# Patient Record
Sex: Female | Born: 1945 | Race: Black or African American | Hispanic: No | Smoking: Former smoker
Health system: Southern US, Community
[De-identification: ages and names within clinical notes are randomized; demographics above are authoritative.]

## PROBLEM LIST (undated history)

## (undated) DIAGNOSIS — K219 Gastro-esophageal reflux disease without esophagitis: Secondary | ICD-10-CM

## (undated) DIAGNOSIS — G47419 Narcolepsy without cataplexy: Secondary | ICD-10-CM

## (undated) DIAGNOSIS — I219 Acute myocardial infarction, unspecified: Secondary | ICD-10-CM

## (undated) DIAGNOSIS — E039 Hypothyroidism, unspecified: Secondary | ICD-10-CM

## (undated) DIAGNOSIS — I639 Cerebral infarction, unspecified: Secondary | ICD-10-CM

## (undated) DIAGNOSIS — IMO0001 Reserved for inherently not codable concepts without codable children: Secondary | ICD-10-CM

## (undated) DIAGNOSIS — M1990 Unspecified osteoarthritis, unspecified site: Secondary | ICD-10-CM

## (undated) DIAGNOSIS — J449 Chronic obstructive pulmonary disease, unspecified: Secondary | ICD-10-CM

## (undated) DIAGNOSIS — I10 Essential (primary) hypertension: Secondary | ICD-10-CM

## (undated) DIAGNOSIS — G4730 Sleep apnea, unspecified: Secondary | ICD-10-CM

## (undated) DIAGNOSIS — E119 Type 2 diabetes mellitus without complications: Secondary | ICD-10-CM

## (undated) DIAGNOSIS — I2510 Atherosclerotic heart disease of native coronary artery without angina pectoris: Secondary | ICD-10-CM

## (undated) HISTORY — PX: CORONARY ANGIOPLASTY: SHX604

## (undated) HISTORY — PX: COLONOSCOPY: SHX174

## (undated) HISTORY — PX: OTHER SURGICAL HISTORY: SHX169

## (undated) HISTORY — PX: EYE SURGERY: SHX253

## (undated) HISTORY — PX: BREAST LUMPECTOMY: SHX2

## (undated) HISTORY — PX: ABDOMINAL HYSTERECTOMY: SHX81

---

## 2006-01-29 HISTORY — PX: CORONARY ANGIOPLASTY WITH STENT PLACEMENT: SHX49

## 2015-02-28 ENCOUNTER — Other Ambulatory Visit: Payer: Self-pay | Admitting: Neurosurgery

## 2015-03-04 NOTE — H&P (Signed)
Patient ID:   [id number] Patient: Shelley Butler  Date of Birth: [birth year] Visit Type: Office Visit   Date: 02/23/2015 02:30 PM Provider: Danae Orleans. Venetia Maxon MD   This 70 year old female presents for back pain and Leg pain.  History of Present Illness: 1.  back pain  2.  Leg pain  Shelley Butler, 70 year old retired female visits for evaluation of bilateral lower extremity pain.  Patient reports onset of pain greater than one year ago and does not recall an injury.  She states legs are painful only with standing or walking.  No pain with sitting.  Does not currently take pain medication.  Physical therapy offered no relief.  History: Narcolepsy, CAD, MI (eight years ago), HTN, IDDM, GERD, thyroid Surgical history: Vaginal repair four years ago, hysterectomy 17 years ago Followed by Dr. Beckie Salts in [facility] cardiology  Imaging on [place]  The patient complains of bilateral lower extremity pain.  She describes that she has pain with walking and that she cannot be standing for more than 15 minutes at a time.  She says it leaning forward helps but not a great deal.  She complains of pain radiating to the ankles and to the top of the ankles but not into her feet.  As that her lower extremity pain is worse than her low back pain.  The patient had injections but says that these did not help her at all.  We discussed the patient's weight and she is 5 feet 3 inches tall and 227 pounds.  She says she has been unable to lose weight and that this is due to thyroid problems and it has been very difficult for her to get weight off since she had radiation to her thyroid.  The patient had lumbar spine radiographs which show spondylolisthesis of L4 and L5 of 10 mm a neutral lateral radiograph and 9 mm on extension and 9.5 mm on flexion.  The spinal stenosis is quite marked at this level based on her MRI scan and she has severe facet arthropathy at the L4 L5 level.  She has multilevel degenerative  changes at other levels in the lumbar spine.        PAST MEDICAL/SURGICAL HISTORY   (Detailed)  Disease/disorder Onset Date Management Date Comments  Diabetes mellitus      High cholesterol      Hypertension      Thyroid disease         PAST MEDICAL HISTORY, SURGICAL HISTORY, FAMILY HISTORY, SOCIAL HISTORY AND REVIEW OF SYSTEMS I have reviewed the patient's past medical, surgical, family and social history as well as the comprehensive review of systems as included on the [facility] history form dated 02/23/2015, which I have signed.  Family History  (Detailed) Relationship Family Member Name Deceased Age at Death Condition Onset Age Cause of Death      Family history of Hypertension  N      Family history of Diabetes mellitus  N    SOCIAL HISTORY  (Detailed) Tobacco use reviewed. Preferred language is Unknown.   Smoking status: Never smoker.  SMOKING STATUS Use Status Type Smoking Status Usage Per Day Years Used Total Pack Years  no/never  Never smoker       HOME ENVIRONMENT/SAFETY The patient has not fallen in the last year.        MEDICATIONS(added, continued or stopped this visit): Started Medication Directions Instruction Stopped   aspirin take 1 tablet by oral route  every day  Dexedrine Spansule 15 mg capsule,extended release take 1 capsule by oral route 3 times every day     gemfibrozil 600 mg tablet take 1 tablet by oral route 2 times every day 30 minutes before morning and evening meal     Humalog inject 20 units by subcutaneous route 3 times every day     Lantus 100 unit/mL subcutaneous solution inject 60 units by subcutaneous route  every bedtime     levothyroxine 88 mcg tablet take 1 tablet by oral route  every day     lisinopril 20 mg-hydrochlorothiazide 12.5 mg tablet take 1 tablet by oral route  every day     metformin 500 mg tablet take 1 tablet by oral route 2 times every day with morning and evening meals      metoprolol tartrate 50 mg tablet take 1 tablet by oral route 2 times every day with meals     omeprazole 20 mg tablet,delayed release take 1 capsule by oral route  every day     pravastatin 40 mg tablet take 1 tablet by oral route  every day     Vitamin D3 Take once a week       ALLERGIES: Ingredient Reaction Medication Name Comment  NO KNOWN ALLERGIES     No known allergies.    Vitals Date Temp F BP Pulse Ht In Wt Lb BMI BSA Pain Score  02/23/2015  139/82 97 63 227 40.21  5/10     PHYSICAL EXAM General Level of Distress: no acute distress Overall Appearance: obese  Head and Face  Right Left  Fundoscopic Exam:  normal normal    Cardiovascular Cardiac: regular rate and rhythm without murmur  Right Left  Carotid Pulses: normal normal  Respiratory Lungs: clear to auscultation  Neurological Orientation: normal Recent and Remote Memory: normal Attention Span and Concentration:   normal Language: normal Fund of Knowledge: normal  Right Left Sensation: normal normal Upper Extremity Coordination: normal normal  Lower Extremity Coordination: normal normal  Musculoskeletal Gait and Station: normal  Right Left Upper Extremity Muscle Strength: normal normal Lower Extremity Muscle Strength: normal normal Upper Extremity Muscle Tone:  normal normal Lower Extremity Muscle Tone: normal normal  Motor Strength Upper and lower extremity motor strength was tested in the clinically pertinent muscles.     Deep Tendon Reflexes  Right Left Biceps: normal normal Triceps: normal normal Brachiloradialis: normal normal Patellar: decrease decrease Achilles: decrease decrease  Sensory Sensation was tested at L1 to S1.   Cranial Nerves II. Optic Nerve/Visual Fields: normal III. Oculomotor: normal IV. Trochlear: normal V. Trigeminal: normal VI. Abducens: normal VII. Facial: normal VIII. Acoustic/Vestibular: normal IX. Glossopharyngeal: normal X. Vagus: normal XI.  Spinal Accessory: normal XII. Hypoglossal: normal  Motor and other Tests Lhermittes: negative Rhomberg: negative Pronator drift: absent     Right Left Hoffman's: normal normal Clonus: normal normal Babinski: normal normal SLR: negative negative Patrick's Pearlean Brownie): negative negative Toe Walk: normal normal Toe Lift: normal normal Heel Walk: normal normal SI Joint: nontender nontender   Additional Findings:  Patient is able to bend forward of the floor with her upper extremities outstretched and she is able to stand on either leg independently and to stand on her heels and toes independently and also to squat on either leg.    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  She is now unable to function because of the severity of her pain and says that her legs are more  bothersome than her low back.  She is diabetic and also has a history of coronary artery disease and prior myocardial infarction.  Completed Orders (this encounter) Order Details Reason Side Interpretation Result Initial Treatment Date Region  Lifestyle education regarding diet Encouraged to eat a well balanced diet and follow up with primary care physician.        Lumbar Spine- AP/Lat/Flex/Ex      02/23/2015 All Levels to All Levels   Assessment/Plan # Detail Type Description   1. Assessment Low back pain, unspecified back pain laterality, with sciatica presence unspecified (M54.5).       2. Assessment Spinal stenosis of lumbar region (M48.06).       3. Assessment Body mass index (BMI) 40.0-44.9, adult (Z68.41).   Plan Orders Today's instructions / counseling include(s) Lifestyle education regarding diet.       4. Assessment Lumbar radiculopathy (M54.16).       5. Assessment Spondylolisthesis at L4-L5 level (M43.16).         Pain Assessment/Treatment Pain Scale: 5/10. Method: Numeric Pain Intensity Scale. Location: back/legs. Onset: 02/22/2014. Duration:  varies. Quality: discomforting. Pain Assessment/Treatment follow-up plan of care: Patient is taking medications as prescribed..  Fall Risk Plan The patient has not fallen in the last year.  I have recommended to the patient that she undergo cardiology clearance and that she undergo L4 L5 decompression and fusion.  She is fitted for LSO brace.  We went over detail patient teaching with her today and answered her questions and also reviewed her imaging studies.  The surgery is tentatively scheduled for 03/18/15.  Risks and benefits were discussed in detail and she wishes to proceed.  She is advised the importance of weight control.  I explained that she will need to aggressively lose weight after her surgery in order to prevent additional problems with her spine in the future.  Orders: Diagnostic Procedures: Assessment Procedure  M43.16 Lumbar Spine- AP/Lat  M54.16 Lumbar Spine- AP/Lat/Flex/Ex  Instruction(s)/Education: Assessment Instruction  Z68.41 Lifestyle education regarding diet             Provider:  Danae Orleans. Venetia Maxon MD  02/23/2015 04:33 PM Dictation edited by: Danae Orleans. Venetia Maxon    CC Providers: Freddi Starr [address] [place], [place] [address]              Electronically signed by Danae Orleans. Venetia Maxon MD on 02/23/2015 04:33 PM

## 2015-03-10 ENCOUNTER — Encounter (HOSPITAL_COMMUNITY)
Admission: RE | Admit: 2015-03-10 | Discharge: 2015-03-10 | Disposition: A | Discharge status: Home / Self Care | Payer: Medicare PPO | Source: Ambulatory Visit | Attending: Neurosurgery | Admitting: Neurosurgery

## 2015-03-10 ENCOUNTER — Encounter (HOSPITAL_COMMUNITY): Payer: Self-pay

## 2015-03-10 DIAGNOSIS — Z7982 Long term (current) use of aspirin: Secondary | ICD-10-CM | POA: Insufficient documentation

## 2015-03-10 DIAGNOSIS — I2510 Atherosclerotic heart disease of native coronary artery without angina pectoris: Secondary | ICD-10-CM | POA: Insufficient documentation

## 2015-03-10 DIAGNOSIS — E039 Hypothyroidism, unspecified: Secondary | ICD-10-CM | POA: Insufficient documentation

## 2015-03-10 DIAGNOSIS — I10 Essential (primary) hypertension: Secondary | ICD-10-CM | POA: Diagnosis not present

## 2015-03-10 DIAGNOSIS — K219 Gastro-esophageal reflux disease without esophagitis: Secondary | ICD-10-CM | POA: Diagnosis not present

## 2015-03-10 DIAGNOSIS — Z955 Presence of coronary angioplasty implant and graft: Secondary | ICD-10-CM | POA: Insufficient documentation

## 2015-03-10 DIAGNOSIS — Z87891 Personal history of nicotine dependence: Secondary | ICD-10-CM | POA: Diagnosis not present

## 2015-03-10 DIAGNOSIS — Z01812 Encounter for preprocedural laboratory examination: Secondary | ICD-10-CM | POA: Insufficient documentation

## 2015-03-10 DIAGNOSIS — Z794 Long term (current) use of insulin: Secondary | ICD-10-CM | POA: Diagnosis not present

## 2015-03-10 DIAGNOSIS — I252 Old myocardial infarction: Secondary | ICD-10-CM | POA: Diagnosis not present

## 2015-03-10 DIAGNOSIS — G47419 Narcolepsy without cataplexy: Secondary | ICD-10-CM | POA: Diagnosis not present

## 2015-03-10 DIAGNOSIS — Z01818 Encounter for other preprocedural examination: Secondary | ICD-10-CM | POA: Diagnosis not present

## 2015-03-10 DIAGNOSIS — E119 Type 2 diabetes mellitus without complications: Secondary | ICD-10-CM | POA: Insufficient documentation

## 2015-03-10 DIAGNOSIS — Z79899 Other long term (current) drug therapy: Secondary | ICD-10-CM | POA: Insufficient documentation

## 2015-03-10 DIAGNOSIS — Z0183 Encounter for blood typing: Secondary | ICD-10-CM | POA: Insufficient documentation

## 2015-03-10 HISTORY — DX: Unspecified osteoarthritis, unspecified site: M19.90

## 2015-03-10 HISTORY — DX: Acute myocardial infarction, unspecified: I21.9

## 2015-03-10 HISTORY — DX: Reserved for inherently not codable concepts without codable children: IMO0001

## 2015-03-10 HISTORY — DX: Type 2 diabetes mellitus without complications: E11.9

## 2015-03-10 HISTORY — DX: Essential (primary) hypertension: I10

## 2015-03-10 HISTORY — DX: Gastro-esophageal reflux disease without esophagitis: K21.9

## 2015-03-10 HISTORY — DX: Hypothyroidism, unspecified: E03.9

## 2015-03-10 HISTORY — DX: Sleep apnea, unspecified: G47.30

## 2015-03-10 HISTORY — DX: Narcolepsy without cataplexy: G47.419

## 2015-03-10 LAB — CBC
HCT: 40.7 % (ref 36.0–46.0)
Hemoglobin: 13.3 g/dL (ref 12.0–15.0)
MCH: 28.7 pg (ref 26.0–34.0)
MCHC: 32.7 g/dL (ref 30.0–36.0)
MCV: 87.9 fL (ref 78.0–100.0)
PLATELETS: 340 10*3/uL (ref 150–400)
RBC: 4.63 MIL/uL (ref 3.87–5.11)
RDW: 13.4 % (ref 11.5–15.5)
WBC: 9.7 10*3/uL (ref 4.0–10.5)

## 2015-03-10 LAB — BASIC METABOLIC PANEL
Anion gap: 12 (ref 5–15)
BUN: 21 mg/dL — AB (ref 6–20)
CALCIUM: 9.8 mg/dL (ref 8.9–10.3)
CO2: 26 mmol/L (ref 22–32)
CREATININE: 1.17 mg/dL — AB (ref 0.44–1.00)
Chloride: 103 mmol/L (ref 101–111)
GFR calc non Af Amer: 47 mL/min — ABNORMAL LOW (ref >60)
GFR, EST AFRICAN AMERICAN: 54 mL/min — AB (ref >60)
Glucose, Bld: 234 mg/dL — ABNORMAL HIGH (ref 65–99)
Potassium: 4.3 mmol/L (ref 3.5–5.1)
SODIUM: 141 mmol/L (ref 135–145)

## 2015-03-10 LAB — TYPE AND SCREEN
ABO/RH(D): O POS
Antibody Screen: NEGATIVE

## 2015-03-10 LAB — GLUCOSE, CAPILLARY: Glucose-Capillary: 219 mg/dL — ABNORMAL HIGH (ref 65–99)

## 2015-03-10 LAB — SURGICAL PCR SCREEN
MRSA, PCR: NEGATIVE
Staphylococcus aureus: POSITIVE — AB

## 2015-03-10 LAB — ABO/RH: ABO/RH(D): O POS

## 2015-03-10 NOTE — Progress Notes (Signed)
Shelley Butler reports having a MI in 2008, had a stent placed.  Patient is on Aspirin as Blood thinner.  Patient states that she was instructed to stop taking Aspirin on Monday, February, February 13, instructed by Shelley Butler office who told her they were in contact with her cardiologist. Shelley Butler is patient;s cardiologist. Shelley Butler denies having chest pain.  Patient does have shortness of breath when walking.  Patient reports having a stress test test in [address] , an EKG 09/2014. Patient had a postive sleep study, but does not use CPAP, cant afford it.  Patient states that Shelley Butler is her Pulmonologist and he says she has COPD   She doesn't think so.  Patients PCP is Shelley Butler, he also manages her DM.  Shelley Bible had a endrcrinocologist until sometime in 2016 when he left [place].  Patient reports that am CBG run in the 100, and last A1C was 9.6- drawn 03/07/15.  Shelley Butler reports that she had been taking Lantus every other day due to the cost, but has started taking it everyday. I talked with Shelley Butler about the importance of keeping CBG < 180 to prevent infection and assist with healing post- op.   Patient verbalizes understanding. Shelley Butler also reports having Narcolespy but no longer takes medications, "I took it when I worked."

## 2015-03-10 NOTE — Pre-Procedure Instructions (Signed)
    Shelley Butler  03/10/2015     Your procedure is scheduled on Friday, February 17  Report to [facility] Admitting at 9:15 A.M.                Your surgery or procedure is scheduled for 12:15 PM   Call this number if you have problems the morning of [phone]                    For any other questions, please call [phone], Monday - Friday 8 AM - 4 PM.  Remember:  Do not eat food or drink liquids after midnight.  Take these medicines the morning of surgery with A SIP OF WATER :levothyroxine (SYNTHROID, LEVOTHROID),   metoprolol (LOPRESSOR),   omeprazole (PRILOSEC), pravastatin (PRAVACHOL).              May use Albuterol if needed.  Do not wear jewelry, make-up or nail polish.  Do not wear lotions, powders, or perfumes.    Do not shave 48 hours prior to surgery.    Do not bring valuables to the hospital.  [facility] is not responsible for any belongings or valuables.  Contacts, dentures or bridgework may not be worn into surgery.  Leave your suitcase in the car.  After surgery it may be brought to your room.  For patients admitted to the hospital, discharge time will be determined by your treatment team.  Special instructions:  Review  [facility] - Preparing For Surgery.  Please read over the following fact sheets that you were given. Pain Booklet, Coughing and Deep Breathing, Blood Transfusion Information, MRSA Information and Surgical Site Infection Prevention

## 2015-03-10 NOTE — Progress Notes (Signed)
How to Manage Your Diabetes Before Surgery   Why is it important to control my blood sugar before and after surgery?   Improving blood sugar levels before and after surgery helps healing and can limit problems.  A way of improving blood sugar control is eating a healthy diet by:  - Eating less sugar and carbohydrates  - Increasing activity/exercise  - Talk with your doctor about reaching your blood sugar goals  High blood sugars (greater than 180 mg/dL) can raise your risk of infections and slow down your recovery so you will need to focus on controlling your diabetes during the weeks before surgery.  Make sure that the doctor who takes care of your diabetes knows about your planned surgery including the date and location.  How do I manage my blood sugars before surgery?   Check your blood sugar at least 4 times a day, 2 days before surgery to make sure that they are not too high or low.   Check your blood sugar the morning of your surgery when you wake up and every 2               hours until you get to the Short-Stay unit.  If your blood sugar is less than 70 mg/dL, you will need to treat for low blood sugar by:  Treat a low blood sugar (less than 70 mg/dL) with 1/2 cup of clear juice (cranberry or apple), 4 glucose tablets, OR glucose gel.  Recheck blood sugar in 15 minutes after treatment (to make sure it is greater than 70 mg/dL).  If blood sugar is not greater than 70 mg/dL on re-check, call [phone] for further instructions.   Report your blood sugar to the Short-Stay nurse when you get to Short-Stay.  References:  [facility], 2007 "How to Manage your Diabetes Before and After Surgery".  What do I do about my diabetes medications?   Do not take oral diabetes medicines (pills) the morning of surgery.    THE NIGHT BEFORE SURGERY, take 40 units of Lantus Insulin.    Do not take other diabetes injectables the day of surgery including  Byetta, Victoza, Bydureon, and Trulicity.    If your CBG is greater than 220 mg/dL, you may take 1/2 of your sliding scale (correction) dose of insulin.Marland Kitchen

## 2015-03-10 NOTE — Progress Notes (Signed)
I called a prescription for Mupirocin ointment to [facility], [address] , [place], [place]

## 2015-03-14 ENCOUNTER — Other Ambulatory Visit: Payer: Self-pay | Admitting: Neurosurgery

## 2015-03-14 NOTE — Progress Notes (Addendum)
Anesthesia Chart Review: Patient is a 70 year old female scheduled for L4-5 MAS, PLIF on 03/18/15 by Dr. Venetia Maxon.  History includes former smoker, DM2, CAD/MI s/p RCA stent '07, HTN, hypothyroidism, GERD, narcolepsy, arthritis, exertional dyspnea, hysterectomy. She reports a positive sleep study but does not use CPAP ("can't afford"). Cardiology note indicate a lone episode of afib 10/29/13 in the setting of hyperthyroidism. BMI is consistent with obesity.   PCP is Dr. Renaldo Harrison who also manages her DM. She had previously seen an endocrinologist (Dr. Renaee Munda), but he left [place]. Pulmonologist is Dr. Wandra Mannan, records with CXR pending.  Cardiologist was reported as Dr. Phylliss Bob, although last office visit was with Dr. Altamease Oiler with [facility] in 10/2014.   Meds include albuterol, aspirin, Lopid, Humalog, Lantus, Synthroid, lisinopril-HCTZ, metformin, Lopressor, Prilosec, pravastatin.  11/12/14 EKG [facility]): SR, LAD. Voltage criteria for LVH.  12/02/13 Nuclear stress test [facility]): Conclusion: Normal stress test, with no evidence of myocardial ischemia at workload achieved. There is inferior wall hypokinesis at rest and no new SWMA post exercise. This is a submaximal EST. Recommendations: Continue medical therapy and referred to pulmonary clinic for wheezing and shortness of breath.  According to 11/12/14 records by Dr. Sonia Baller, 09/05/05 cath showed: "EF 60%. 75% OM1/95% RCA. PTCA/RCA same day 2.75X28 Taxus with IVUS, prox end dilated to 4.6. Well deployed with IVUS."  Preoperative labs noted. A1c at Dr. Hart Robinsons' office on 03/07/15 was 9.6. She admittedly was only taking Lantus every other day at that time due to cost, but says she has since started taking it every day. She reported her morning CBG run in the 100 range.   I have communicated with Shanda Bumps at Dr. Fredrich Birks office. She was notified of recent elevated A1c result.  Shanda Bumps did request cardiac clearance but is awaiting a response. Will follow-up pulmonology records and cardiac clearance once received.  Velna Ochs [facility] Short Stay Center/Anesthesiology Phone [phone] 03/14/2015 3:09 PM  Addendum: Dr. Rockne Menghini composed a note of cardiac clearance for this surgery.  Pulmonology records also reviewed. Last visit 03/14/15 with Dr. Malachi Pro. His note indicated that patient has been unable to use CPAP/BiPAP for her OSA but does use O2 as directed (history of overnight hypoxemia). His note also indicated that patient has a known history of an abnormal chest CT, "scarring type nodules" that were reassuring on 01/13/15 follow-up CT. He is planning for one more follow-up CT in 6 months. RA ABG then showed pH of 7.41, PCO2 40, PO2 70, HCO3 25. By notes, it appears she was not having any acute pulmonary issues.   She has cardiology clearance and recent pulmonology follow-up. If no acute changes and CBG acceptable on the day of surgery then I would anticipate that she can proceed as planned. Should be using nocturnal O2 chronically.  Velna Ochs [facility] Short Stay Center/Anesthesiology Phone [phone] 03/16/2015 12:14 PM

## 2015-03-16 ENCOUNTER — Encounter (HOSPITAL_COMMUNITY): Payer: Self-pay

## 2015-03-17 MED ORDER — CEFAZOLIN SODIUM-DEXTROSE 2-3 GM-% IV SOLR
2.0000 g | INTRAVENOUS | Status: AC
  Administered 2015-03-18: 1 g via INTRAVENOUS
  Administered 2015-03-18: 2 g via INTRAVENOUS
  Filled 2015-03-17: qty 50

## 2015-03-18 ENCOUNTER — Inpatient Hospital Stay (HOSPITAL_COMMUNITY): Payer: Medicare PPO

## 2015-03-18 ENCOUNTER — Encounter (HOSPITAL_COMMUNITY)
Admission: RE | Disposition: A | Discharge status: Home Health | Payer: Self-pay | Source: Ambulatory Visit | Attending: Neurosurgery

## 2015-03-18 ENCOUNTER — Inpatient Hospital Stay (HOSPITAL_COMMUNITY): Payer: Medicare PPO | Admitting: Anesthesiology

## 2015-03-18 ENCOUNTER — Encounter (HOSPITAL_COMMUNITY): Payer: Self-pay | Admitting: *Deleted

## 2015-03-18 ENCOUNTER — Inpatient Hospital Stay (HOSPITAL_COMMUNITY): Payer: Medicare PPO | Admitting: Vascular Surgery

## 2015-03-18 ENCOUNTER — Inpatient Hospital Stay (HOSPITAL_COMMUNITY)
Admission: RE | Admit: 2015-03-18 | Discharge: 2015-03-22 | DRG: 460 | Disposition: A | Discharge status: Home Health | Payer: Medicare PPO | Source: Ambulatory Visit | Attending: Neurosurgery | Admitting: Neurosurgery

## 2015-03-18 DIAGNOSIS — I2510 Atherosclerotic heart disease of native coronary artery without angina pectoris: Secondary | ICD-10-CM | POA: Diagnosis present

## 2015-03-18 DIAGNOSIS — M479 Spondylosis, unspecified: Secondary | ICD-10-CM | POA: Diagnosis present

## 2015-03-18 DIAGNOSIS — Z794 Long term (current) use of insulin: Secondary | ICD-10-CM | POA: Diagnosis not present

## 2015-03-18 DIAGNOSIS — E039 Hypothyroidism, unspecified: Secondary | ICD-10-CM | POA: Diagnosis present

## 2015-03-18 DIAGNOSIS — M4316 Spondylolisthesis, lumbar region: Principal | ICD-10-CM | POA: Diagnosis present

## 2015-03-18 DIAGNOSIS — E662 Morbid (severe) obesity with alveolar hypoventilation: Secondary | ICD-10-CM | POA: Diagnosis present

## 2015-03-18 DIAGNOSIS — I10 Essential (primary) hypertension: Secondary | ICD-10-CM | POA: Diagnosis present

## 2015-03-18 DIAGNOSIS — Z7982 Long term (current) use of aspirin: Secondary | ICD-10-CM

## 2015-03-18 DIAGNOSIS — M549 Dorsalgia, unspecified: Secondary | ICD-10-CM | POA: Diagnosis present

## 2015-03-18 DIAGNOSIS — Z87891 Personal history of nicotine dependence: Secondary | ICD-10-CM

## 2015-03-18 DIAGNOSIS — Z6841 Body Mass Index (BMI) 40.0 and over, adult: Secondary | ICD-10-CM

## 2015-03-18 DIAGNOSIS — M5416 Radiculopathy, lumbar region: Secondary | ICD-10-CM | POA: Diagnosis present

## 2015-03-18 DIAGNOSIS — E119 Type 2 diabetes mellitus without complications: Secondary | ICD-10-CM | POA: Diagnosis present

## 2015-03-18 DIAGNOSIS — Z419 Encounter for procedure for purposes other than remedying health state, unspecified: Secondary | ICD-10-CM

## 2015-03-18 HISTORY — PX: MAXIMUM ACCESS (MAS)POSTERIOR LUMBAR INTERBODY FUSION (PLIF) 1 LEVEL: SHX6368

## 2015-03-18 LAB — GLUCOSE, CAPILLARY
GLUCOSE-CAPILLARY: 102 mg/dL — AB (ref 65–99)
GLUCOSE-CAPILLARY: 117 mg/dL — AB (ref 65–99)
GLUCOSE-CAPILLARY: 231 mg/dL — AB (ref 65–99)
Glucose-Capillary: 116 mg/dL — ABNORMAL HIGH (ref 65–99)

## 2015-03-18 SURGERY — FOR MAXIMUM ACCESS (MAS) POSTERIOR LUMBAR INTERBODY FUSION (PLIF) 1 LEVEL
Anesthesia: General | Site: Spine Lumbar

## 2015-03-18 MED ORDER — BUPIVACAINE LIPOSOME 1.3 % IJ SUSP
INTRAMUSCULAR | Status: DC | PRN
  Administered 2015-03-18: 20 mL

## 2015-03-18 MED ORDER — PROMETHAZINE HCL 25 MG/ML IJ SOLN
6.2500 mg | INTRAMUSCULAR | Status: DC | PRN
  Administered 2015-03-18: 6.25 mg via INTRAVENOUS

## 2015-03-18 MED ORDER — SUCCINYLCHOLINE CHLORIDE 20 MG/ML IJ SOLN
INTRAMUSCULAR | Status: DC | PRN
  Administered 2015-03-18: 100 mg via INTRAVENOUS

## 2015-03-18 MED ORDER — PROPOFOL 10 MG/ML IV BOLUS
INTRAVENOUS | Status: AC
  Filled 2015-03-18: qty 20

## 2015-03-18 MED ORDER — NEOSTIGMINE METHYLSULFATE 10 MG/10ML IV SOLN
INTRAVENOUS | Status: AC
  Filled 2015-03-18: qty 1

## 2015-03-18 MED ORDER — PHENYLEPHRINE HCL 10 MG/ML IJ SOLN
10.0000 mg | INTRAVENOUS | Status: DC | PRN
  Administered 2015-03-18: 20 ug/min via INTRAVENOUS

## 2015-03-18 MED ORDER — ALUM & MAG HYDROXIDE-SIMETH 200-200-20 MG/5ML PO SUSP: 30.0000 mL | Freq: Four times a day (QID) | ORAL | Status: DC | PRN

## 2015-03-18 MED ORDER — PRAVASTATIN SODIUM 40 MG PO TABS
40.0000 mg | ORAL_TABLET | Freq: Every day | ORAL | Status: DC
Start: 2015-03-19 — End: 2015-03-22
  Administered 2015-03-19 – 2015-03-22 (×4): 40 mg via ORAL
  Filled 2015-03-18 (×4): qty 1

## 2015-03-18 MED ORDER — METOPROLOL TARTRATE 50 MG PO TABS
50.0000 mg | ORAL_TABLET | Freq: Two times a day (BID) | ORAL | Status: DC
  Administered 2015-03-20 – 2015-03-22 (×3): 50 mg via ORAL
  Filled 2015-03-18 (×6): qty 1

## 2015-03-18 MED ORDER — LEVOTHYROXINE SODIUM 88 MCG PO TABS
88.0000 ug | ORAL_TABLET | Freq: Every day | ORAL | Status: DC
  Administered 2015-03-19 – 2015-03-22 (×4): 88 ug via ORAL
  Filled 2015-03-18 (×4): qty 1

## 2015-03-18 MED ORDER — HYDROMORPHONE HCL 1 MG/ML IJ SOLN
0.5000 mg | INTRAMUSCULAR | Status: DC | PRN
  Administered 2015-03-18 – 2015-03-19 (×2): 1 mg via INTRAVENOUS
  Filled 2015-03-18 (×2): qty 1

## 2015-03-18 MED ORDER — SODIUM CHLORIDE 0.9 % IJ SOLN
INTRAMUSCULAR | Status: AC
  Filled 2015-03-18: qty 10

## 2015-03-18 MED ORDER — EPINEPHRINE HCL 0.1 MG/ML IJ SOSY
PREFILLED_SYRINGE | INTRAMUSCULAR | Status: AC
  Filled 2015-03-18: qty 10

## 2015-03-18 MED ORDER — DEXMEDETOMIDINE HCL 200 MCG/2ML IV SOLN
INTRAVENOUS | Status: DC | PRN
  Administered 2015-03-18: 4 ug via INTRAVENOUS
  Administered 2015-03-18: 8 ug via INTRAVENOUS
  Administered 2015-03-18: 4 ug via INTRAVENOUS

## 2015-03-18 MED ORDER — PHENYLEPHRINE HCL 10 MG/ML IJ SOLN
INTRAMUSCULAR | Status: AC
  Filled 2015-03-18: qty 1

## 2015-03-18 MED ORDER — BUPIVACAINE HCL (PF) 0.5 % IJ SOLN
INTRAMUSCULAR | Status: DC | PRN
  Administered 2015-03-18: 10 mL

## 2015-03-18 MED ORDER — LACTATED RINGERS IV SOLN
INTRAVENOUS | Status: DC | PRN
  Administered 2015-03-18: 14:00:00 via INTRAVENOUS

## 2015-03-18 MED ORDER — LIDOCAINE HCL (CARDIAC) 20 MG/ML IV SOLN
INTRAVENOUS | Status: AC
  Filled 2015-03-18: qty 5

## 2015-03-18 MED ORDER — METFORMIN HCL 500 MG PO TABS
1000.0000 mg | ORAL_TABLET | Freq: Two times a day (BID) | ORAL | Status: DC
  Administered 2015-03-19 – 2015-03-22 (×5): 1000 mg via ORAL
  Filled 2015-03-18 (×6): qty 2

## 2015-03-18 MED ORDER — PHENYLEPHRINE 40 MCG/ML (10ML) SYRINGE FOR IV PUSH (FOR BLOOD PRESSURE SUPPORT)
PREFILLED_SYRINGE | INTRAVENOUS | Status: AC
  Filled 2015-03-18: qty 30

## 2015-03-18 MED ORDER — HYDROMORPHONE HCL 1 MG/ML IJ SOLN
0.2500 mg | INTRAMUSCULAR | Status: DC | PRN
  Administered 2015-03-18: 0.5 mg via INTRAVENOUS

## 2015-03-18 MED ORDER — ZOLPIDEM TARTRATE 5 MG PO TABS: 5.0000 mg | ORAL_TABLET | Freq: Every evening | ORAL | Status: DC | PRN

## 2015-03-18 MED ORDER — ONDANSETRON HCL 4 MG/2ML IJ SOLN
INTRAMUSCULAR | Status: DC | PRN
  Administered 2015-03-18: 4 mg via INTRAVENOUS

## 2015-03-18 MED ORDER — LACTATED RINGERS IV SOLN
INTRAVENOUS | Status: DC
  Administered 2015-03-18 (×2): via INTRAVENOUS

## 2015-03-18 MED ORDER — BISACODYL 10 MG RE SUPP: 10.0000 mg | Freq: Every day | RECTAL | Status: DC | PRN

## 2015-03-18 MED ORDER — INSULIN ASPART 100 UNIT/ML ~~LOC~~ SOLN
0.0000 [IU] | Freq: Every day | SUBCUTANEOUS | Status: DC
  Administered 2015-03-18: 2 [IU] via SUBCUTANEOUS

## 2015-03-18 MED ORDER — VITAMIN D (ERGOCALCIFEROL) 1.25 MG (50000 UNIT) PO CAPS
50000.0000 [IU] | ORAL_CAPSULE | ORAL | Status: DC
  Administered 2015-03-20: 50000 [IU] via ORAL
  Filled 2015-03-18: qty 1

## 2015-03-18 MED ORDER — CEFAZOLIN SODIUM-DEXTROSE 2-3 GM-% IV SOLR
2.0000 g | Freq: Three times a day (TID) | INTRAVENOUS | Status: AC
  Administered 2015-03-18 – 2015-03-19 (×2): 2 g via INTRAVENOUS
  Filled 2015-03-18 (×2): qty 50

## 2015-03-18 MED ORDER — DOCUSATE SODIUM 100 MG PO CAPS
100.0000 mg | ORAL_CAPSULE | Freq: Two times a day (BID) | ORAL | Status: DC
  Administered 2015-03-18 – 2015-03-22 (×8): 100 mg via ORAL
  Filled 2015-03-18 (×8): qty 1

## 2015-03-18 MED ORDER — SODIUM CHLORIDE 0.9% FLUSH: 3.0000 mL | INTRAVENOUS | Status: DC | PRN

## 2015-03-18 MED ORDER — THROMBIN 20000 UNITS EX SOLR
CUTANEOUS | Status: DC | PRN
  Administered 2015-03-18: 14:00:00 via TOPICAL

## 2015-03-18 MED ORDER — INSULIN ASPART 100 UNIT/ML ~~LOC~~ SOLN
0.0000 [IU] | Freq: Three times a day (TID) | SUBCUTANEOUS | Status: DC
  Administered 2015-03-19: 2 [IU] via SUBCUTANEOUS
  Administered 2015-03-19: 3 [IU] via SUBCUTANEOUS
  Administered 2015-03-19 – 2015-03-20 (×2): 2 [IU] via SUBCUTANEOUS
  Administered 2015-03-20: 3 [IU] via SUBCUTANEOUS
  Administered 2015-03-20: 2 [IU] via SUBCUTANEOUS
  Administered 2015-03-21: 3 [IU] via SUBCUTANEOUS
  Administered 2015-03-22: 2 [IU] via SUBCUTANEOUS

## 2015-03-18 MED ORDER — ASPIRIN EC 325 MG PO TBEC
325.0000 mg | DELAYED_RELEASE_TABLET | Freq: Every day | ORAL | Status: DC
  Administered 2015-03-19 – 2015-03-22 (×4): 325 mg via ORAL
  Filled 2015-03-18 (×4): qty 1

## 2015-03-18 MED ORDER — ROCURONIUM BROMIDE 50 MG/5ML IV SOLN
INTRAVENOUS | Status: AC
  Filled 2015-03-18: qty 1

## 2015-03-18 MED ORDER — OXYCODONE HCL 5 MG/5ML PO SOLN: 5.0000 mg | Freq: Once | ORAL | Status: DC | PRN

## 2015-03-18 MED ORDER — LIDOCAINE-EPINEPHRINE 1 %-1:100000 IJ SOLN
INTRAMUSCULAR | Status: DC | PRN
  Administered 2015-03-18: 10 mL

## 2015-03-18 MED ORDER — LISINOPRIL-HYDROCHLOROTHIAZIDE 20-12.5 MG PO TABS: 1.0000 | ORAL_TABLET | Freq: Every day | ORAL | Status: DC

## 2015-03-18 MED ORDER — MENTHOL 3 MG MT LOZG: 1.0000 | LOZENGE | OROMUCOSAL | Status: DC | PRN

## 2015-03-18 MED ORDER — PROMETHAZINE HCL 25 MG/ML IJ SOLN
INTRAMUSCULAR | Status: AC
  Filled 2015-03-18: qty 1

## 2015-03-18 MED ORDER — ROCURONIUM BROMIDE 50 MG/5ML IV SOLN
INTRAVENOUS | Status: AC
  Filled 2015-03-18: qty 5

## 2015-03-18 MED ORDER — PHENOL 1.4 % MT LIQD: 1.0000 | OROMUCOSAL | Status: DC | PRN

## 2015-03-18 MED ORDER — SUFENTANIL CITRATE 50 MCG/ML IV SOLN
INTRAVENOUS | Status: AC
  Filled 2015-03-18: qty 1

## 2015-03-18 MED ORDER — PHENYLEPHRINE HCL 10 MG/ML IJ SOLN
INTRAMUSCULAR | Status: DC | PRN
  Administered 2015-03-18: 40 ug via INTRAVENOUS
  Administered 2015-03-18: 240 ug via INTRAVENOUS
  Administered 2015-03-18: 40 ug via INTRAVENOUS
  Administered 2015-03-18: 80 ug via INTRAVENOUS

## 2015-03-18 MED ORDER — ONDANSETRON HCL 4 MG/2ML IJ SOLN
4.0000 mg | INTRAMUSCULAR | Status: DC | PRN
  Filled 2015-03-18: qty 2

## 2015-03-18 MED ORDER — HYDROCHLOROTHIAZIDE 12.5 MG PO CAPS
12.5000 mg | ORAL_CAPSULE | Freq: Every day | ORAL | Status: DC
  Administered 2015-03-19 – 2015-03-22 (×3): 12.5 mg via ORAL
  Filled 2015-03-18 (×4): qty 1

## 2015-03-18 MED ORDER — PANTOPRAZOLE SODIUM 40 MG IV SOLR: 40.0000 mg | Freq: Every day | INTRAVENOUS | Status: DC

## 2015-03-18 MED ORDER — ONDANSETRON HCL 4 MG/2ML IJ SOLN
INTRAMUSCULAR | Status: AC
  Filled 2015-03-18: qty 2

## 2015-03-18 MED ORDER — ACETAMINOPHEN 325 MG PO TABS
650.0000 mg | ORAL_TABLET | ORAL | Status: DC | PRN
  Administered 2015-03-21 (×2): 650 mg via ORAL
  Filled 2015-03-18 (×2): qty 2

## 2015-03-18 MED ORDER — PROPOFOL 10 MG/ML IV BOLUS
INTRAVENOUS | Status: DC | PRN
  Administered 2015-03-18: 110 mg via INTRAVENOUS
  Administered 2015-03-18: 20 mg via INTRAVENOUS
  Administered 2015-03-18: 40 mg via INTRAVENOUS
  Administered 2015-03-18 (×2): 20 mg via INTRAVENOUS
  Administered 2015-03-18: 10 mg via INTRAVENOUS

## 2015-03-18 MED ORDER — OXYCODONE HCL 5 MG PO TABS: 5.0000 mg | ORAL_TABLET | Freq: Once | ORAL | Status: DC | PRN

## 2015-03-18 MED ORDER — ALBUTEROL SULFATE (2.5 MG/3ML) 0.083% IN NEBU
2.5000 mg | INHALATION_SOLUTION | Freq: Four times a day (QID) | RESPIRATORY_TRACT | Status: DC | PRN
  Administered 2015-03-20: 2.5 mg via RESPIRATORY_TRACT
  Filled 2015-03-18: qty 3

## 2015-03-18 MED ORDER — SODIUM CHLORIDE 0.9% FLUSH
3.0000 mL | Freq: Two times a day (BID) | INTRAVENOUS | Status: DC
  Administered 2015-03-18 – 2015-03-22 (×3): 3 mL via INTRAVENOUS

## 2015-03-18 MED ORDER — OXYCODONE-ACETAMINOPHEN 5-325 MG PO TABS
1.0000 | ORAL_TABLET | ORAL | Status: DC | PRN
  Administered 2015-03-19 (×2): 2 via ORAL
  Administered 2015-03-19 – 2015-03-20 (×2): 1 via ORAL
  Administered 2015-03-20: 2 via ORAL
  Administered 2015-03-20: 1 via ORAL
  Administered 2015-03-21: 2 via ORAL
  Filled 2015-03-18 (×2): qty 2
  Filled 2015-03-18: qty 1
  Filled 2015-03-18 (×2): qty 2
  Filled 2015-03-18: qty 1
  Filled 2015-03-18 (×2): qty 2

## 2015-03-18 MED ORDER — MIDAZOLAM HCL 2 MG/2ML IJ SOLN
INTRAMUSCULAR | Status: AC
  Filled 2015-03-18: qty 2

## 2015-03-18 MED ORDER — EPHEDRINE SULFATE 50 MG/ML IJ SOLN
INTRAMUSCULAR | Status: DC | PRN
  Administered 2015-03-18: 10 mg via INTRAVENOUS
  Administered 2015-03-18: 5 mg via INTRAVENOUS

## 2015-03-18 MED ORDER — POLYETHYLENE GLYCOL 3350 17 G PO PACK
17.0000 g | PACK | Freq: Every day | ORAL | Status: DC | PRN
  Administered 2015-03-22: 17 g via ORAL
  Filled 2015-03-18: qty 1

## 2015-03-18 MED ORDER — LIDOCAINE HCL (CARDIAC) 20 MG/ML IV SOLN
INTRAVENOUS | Status: DC | PRN
  Administered 2015-03-18: 100 mg via INTRAVENOUS

## 2015-03-18 MED ORDER — EPHEDRINE SULFATE 50 MG/ML IJ SOLN
INTRAMUSCULAR | Status: AC
  Filled 2015-03-18: qty 1

## 2015-03-18 MED ORDER — ACETAMINOPHEN 650 MG RE SUPP: 650.0000 mg | RECTAL | Status: DC | PRN

## 2015-03-18 MED ORDER — BUPIVACAINE LIPOSOME 1.3 % IJ SUSP
20.0000 mL | Freq: Once | INTRAMUSCULAR | Status: DC
  Filled 2015-03-18: qty 20

## 2015-03-18 MED ORDER — KCL IN DEXTROSE-NACL 20-5-0.45 MEQ/L-%-% IV SOLN
INTRAVENOUS | Status: DC
  Administered 2015-03-18: 19:00:00 via INTRAVENOUS
  Filled 2015-03-18: qty 1000

## 2015-03-18 MED ORDER — HYDROMORPHONE HCL 1 MG/ML IJ SOLN
INTRAMUSCULAR | Status: AC
  Filled 2015-03-18: qty 1

## 2015-03-18 MED ORDER — HYDROCODONE-ACETAMINOPHEN 5-325 MG PO TABS: 1.0000 | ORAL_TABLET | ORAL | Status: DC | PRN

## 2015-03-18 MED ORDER — LISINOPRIL 20 MG PO TABS
20.0000 mg | ORAL_TABLET | Freq: Every day | ORAL | Status: DC
  Administered 2015-03-21 – 2015-03-22 (×2): 20 mg via ORAL
  Filled 2015-03-18 (×2): qty 1

## 2015-03-18 MED ORDER — 0.9 % SODIUM CHLORIDE (POUR BTL) OPTIME
TOPICAL | Status: DC | PRN
  Administered 2015-03-18: 1000 mL

## 2015-03-18 MED ORDER — SODIUM CHLORIDE 0.9 % IV SOLN: 250.0000 mL | INTRAVENOUS | Status: DC

## 2015-03-18 MED ORDER — METHOCARBAMOL 1000 MG/10ML IJ SOLN
500.0000 mg | Freq: Four times a day (QID) | INTRAVENOUS | Status: DC | PRN
  Filled 2015-03-18: qty 5

## 2015-03-18 MED ORDER — GEMFIBROZIL 600 MG PO TABS
600.0000 mg | ORAL_TABLET | Freq: Two times a day (BID) | ORAL | Status: DC
  Administered 2015-03-19 – 2015-03-22 (×7): 600 mg via ORAL
  Filled 2015-03-18 (×9): qty 1

## 2015-03-18 MED ORDER — INSULIN ASPART 100 UNIT/ML ~~LOC~~ SOLN
4.0000 [IU] | Freq: Three times a day (TID) | SUBCUTANEOUS | Status: DC
  Administered 2015-03-19 – 2015-03-22 (×10): 4 [IU] via SUBCUTANEOUS

## 2015-03-18 MED ORDER — SUFENTANIL CITRATE 50 MCG/ML IV SOLN
INTRAVENOUS | Status: DC | PRN
  Administered 2015-03-18 (×3): 5 ug via INTRAVENOUS
  Administered 2015-03-18: 20 ug via INTRAVENOUS
  Administered 2015-03-18: 5 ug via INTRAVENOUS

## 2015-03-18 MED ORDER — INSULIN GLARGINE 100 UNIT/ML ~~LOC~~ SOLN
50.0000 [IU] | Freq: Every evening | SUBCUTANEOUS | Status: DC
  Administered 2015-03-19 – 2015-03-20 (×2): 50 [IU] via SUBCUTANEOUS
  Filled 2015-03-18 (×5): qty 0.5

## 2015-03-18 MED ORDER — PANTOPRAZOLE SODIUM 40 MG PO TBEC
40.0000 mg | DELAYED_RELEASE_TABLET | Freq: Every day | ORAL | Status: DC
  Administered 2015-03-19 – 2015-03-22 (×4): 40 mg via ORAL
  Filled 2015-03-18 (×4): qty 1

## 2015-03-18 MED ORDER — METHOCARBAMOL 500 MG PO TABS
500.0000 mg | ORAL_TABLET | Freq: Four times a day (QID) | ORAL | Status: DC | PRN
  Administered 2015-03-19 – 2015-03-20 (×2): 500 mg via ORAL
  Filled 2015-03-18 (×5): qty 1

## 2015-03-18 MED ORDER — FLEET ENEMA 7-19 GM/118ML RE ENEM: 1.0000 | ENEMA | Freq: Once | RECTAL | Status: DC | PRN

## 2015-03-18 SURGICAL SUPPLY — 88 items
BENZOIN TINCTURE PRP APPL 2/3 (GAUZE/BANDAGES/DRESSINGS) IMPLANT
BIT DRILL PLIF MAS DISP 5.5MM (DRILL) ×1 IMPLANT
BLADE CLIPPER SURG (BLADE) IMPLANT
BONE CANC CHIPS 20CC PCAN1/4 (Bone Implant) ×3 IMPLANT
BUR MATCHSTICK NEURO 3.0 LAGG (BURR) ×3 IMPLANT
BUR ROUND FLUTED 5 RND (BURR) ×2 IMPLANT
BUR ROUND FLUTED 5MM RND (BURR) ×1
CAGE COROENT LG 10X9X23-12 (Cage) ×6 IMPLANT
CANISTER SUCT 3000ML PPV (MISCELLANEOUS) ×3 IMPLANT
CHIPS CANC BONE 20CC PCAN1/4 (Bone Implant) ×1 IMPLANT
CLIP NEUROVISION LG (CLIP) ×3 IMPLANT
CLOSURE WOUND 1/2 X4 (GAUZE/BANDAGES/DRESSINGS)
CONT SPEC 4OZ CLIKSEAL STRL BL (MISCELLANEOUS) ×3 IMPLANT
COVER BACK TABLE 24X17X13 BIG (DRAPES) IMPLANT
COVER BACK TABLE 60X90IN (DRAPES) ×3 IMPLANT
DECANTER SPIKE VIAL GLASS SM (MISCELLANEOUS) ×3 IMPLANT
DERMABOND ADVANCED (GAUZE/BANDAGES/DRESSINGS) ×2
DERMABOND ADVANCED .7 DNX12 (GAUZE/BANDAGES/DRESSINGS) ×1 IMPLANT
DRAPE C-ARM 42X72 X-RAY (DRAPES) ×3 IMPLANT
DRAPE C-ARMOR (DRAPES) ×3 IMPLANT
DRAPE LAPAROTOMY 100X72X124 (DRAPES) ×3 IMPLANT
DRAPE POUCH INSTRU U-SHP 10X18 (DRAPES) ×3 IMPLANT
DRAPE SURG 17X23 STRL (DRAPES) ×3 IMPLANT
DRILL PLIF MAS DISP 5.5MM (DRILL) ×3
DRSG OPSITE POSTOP 4X6 (GAUZE/BANDAGES/DRESSINGS) ×3 IMPLANT
DURAPREP 26ML APPLICATOR (WOUND CARE) ×3 IMPLANT
ELECT REM PT RETURN 9FT ADLT (ELECTROSURGICAL) ×3
ELECTRODE REM PT RTRN 9FT ADLT (ELECTROSURGICAL) ×1 IMPLANT
EVACUATOR 1/8 PVC DRAIN (DRAIN) IMPLANT
GAUZE SPONGE 4X4 12PLY STRL (GAUZE/BANDAGES/DRESSINGS) IMPLANT
GAUZE SPONGE 4X4 16PLY XRAY LF (GAUZE/BANDAGES/DRESSINGS) IMPLANT
GLOVE BIO SURGEON STRL SZ 6.5 (GLOVE) ×4 IMPLANT
GLOVE BIO SURGEON STRL SZ8 (GLOVE) ×3 IMPLANT
GLOVE BIO SURGEONS STRL SZ 6.5 (GLOVE) ×2
GLOVE BIOGEL PI IND STRL 7.0 (GLOVE) ×2 IMPLANT
GLOVE BIOGEL PI IND STRL 8 (GLOVE) ×1 IMPLANT
GLOVE BIOGEL PI IND STRL 8.5 (GLOVE) ×2 IMPLANT
GLOVE BIOGEL PI INDICATOR 7.0 (GLOVE) ×4
GLOVE BIOGEL PI INDICATOR 8 (GLOVE) ×2
GLOVE BIOGEL PI INDICATOR 8.5 (GLOVE) ×4
GLOVE ECLIPSE 8.0 STRL XLNG CF (GLOVE) ×3 IMPLANT
GLOVE ECLIPSE 8.5 STRL (GLOVE) ×3 IMPLANT
GLOVE EXAM NITRILE LRG STRL (GLOVE) IMPLANT
GLOVE EXAM NITRILE MD LF STRL (GLOVE) IMPLANT
GLOVE EXAM NITRILE XL STR (GLOVE) IMPLANT
GLOVE EXAM NITRILE XS STR PU (GLOVE) IMPLANT
GLOVE SURG SS PI 7.0 STRL IVOR (GLOVE) ×9 IMPLANT
GOWN STRL REUS W/ TWL LRG LVL3 (GOWN DISPOSABLE) IMPLANT
GOWN STRL REUS W/ TWL XL LVL3 (GOWN DISPOSABLE) ×3 IMPLANT
GOWN STRL REUS W/TWL 2XL LVL3 (GOWN DISPOSABLE) ×6 IMPLANT
GOWN STRL REUS W/TWL LRG LVL3 (GOWN DISPOSABLE)
GOWN STRL REUS W/TWL XL LVL3 (GOWN DISPOSABLE) ×6
KIT BASIN OR (CUSTOM PROCEDURE TRAY) ×3 IMPLANT
KIT POSITION SURG JACKSON T1 (MISCELLANEOUS) ×3 IMPLANT
KIT ROOM TURNOVER OR (KITS) ×3 IMPLANT
MILL MEDIUM DISP (BLADE) ×3 IMPLANT
MODULE NVM5 NEXT GEN EMG (NEEDLE) ×3 IMPLANT
NEEDLE HYPO 21X1.5 SAFETY (NEEDLE) ×3 IMPLANT
NEEDLE HYPO 25X1 1.5 SAFETY (NEEDLE) ×3 IMPLANT
NEEDLE SPNL 18GX3.5 QUINCKE PK (NEEDLE) IMPLANT
NS IRRIG 1000ML POUR BTL (IV SOLUTION) ×3 IMPLANT
PACK LAMINECTOMY NEURO (CUSTOM PROCEDURE TRAY) ×3 IMPLANT
PAD ARMBOARD 7.5X6 YLW CONV (MISCELLANEOUS) ×9 IMPLANT
PATTIES SURGICAL .5 X.5 (GAUZE/BANDAGES/DRESSINGS) IMPLANT
PATTIES SURGICAL .5 X1 (DISPOSABLE) IMPLANT
PATTIES SURGICAL 1X1 (DISPOSABLE) IMPLANT
ROD 30MM (Rod) ×4 IMPLANT
ROD PLIF MAS PB SPHERX 30 (Rod) ×2 IMPLANT
SCREW LOCK (Screw) ×8 IMPLANT
SCREW LOCK FXNS SPNE MAS PL (Screw) ×4 IMPLANT
SCREW PLIF MAS 5.5X35 LUMBAR (Screw) ×6 IMPLANT
SCREW SHANKS 5.5X35 (Screw) ×6 IMPLANT
SCREW TULIP 5.5 (Screw) ×6 IMPLANT
SPONGE LAP 4X18 X RAY DECT (DISPOSABLE) IMPLANT
SPONGE SURGIFOAM ABS GEL 100 (HEMOSTASIS) ×3 IMPLANT
STAPLER SKIN PROX WIDE 3.9 (STAPLE) IMPLANT
STRIP CLOSURE SKIN 1/2X4 (GAUZE/BANDAGES/DRESSINGS) IMPLANT
SUT VIC AB 1 CT1 18XBRD ANBCTR (SUTURE) ×1 IMPLANT
SUT VIC AB 1 CT1 8-18 (SUTURE) ×2
SUT VIC AB 2-0 CT1 18 (SUTURE) ×3 IMPLANT
SUT VIC AB 3-0 SH 8-18 (SUTURE) ×6 IMPLANT
SYR 20CC LL (SYRINGE) ×3 IMPLANT
SYR 5ML LL (SYRINGE) IMPLANT
TOWEL OR 17X24 6PK STRL BLUE (TOWEL DISPOSABLE) ×3 IMPLANT
TOWEL OR 17X26 10 PK STRL BLUE (TOWEL DISPOSABLE) ×3 IMPLANT
TRAP SPECIMEN MUCOUS 40CC (MISCELLANEOUS) ×3 IMPLANT
TRAY FOLEY W/METER SILVER 14FR (SET/KITS/TRAYS/PACK) ×3 IMPLANT
WATER STERILE IRR 1000ML POUR (IV SOLUTION) ×3 IMPLANT

## 2015-03-18 NOTE — Progress Notes (Signed)
Patient admitted from PACU. Patient alert and oriented x 4. Patient oriented to the room and made comfortable.

## 2015-03-18 NOTE — OR Nursing (Signed)
NUVASIVE NERVE MONITORING ELECTRODES APPLIED TO UPPER TRUNK AND  BILATERAL LOWER EXTREMITY APPLIED AFTER INDUCTION.

## 2015-03-18 NOTE — Op Note (Signed)
03/18/2015  4:54 PM  PATIENT:  Shelley Butler  70 y.o. 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, stenosis, spondylosis, radiculopathy, morbid obesity, diabetes  PROCEDURE:  Procedure(s): Lumbar four-five Maximum access posterior lumbar interbody fusion (N/A) with PEEK cages, autograft, allograft, pedicle screws, posterolateral arthrodesis  Decompression greater than for standard PLIF surgery   SURGEON:  Surgeon(s) and Role:    * Maeola Harman, MD - Primary    * Barnett Abu, MD - Assisting  PHYSICIAN ASSISTANT:   ASSISTANTS: Poteat, RN   ANESTHESIA:   general  EBL:  Total I/O In: 1700 [I.V.:1700] Out: 675 [Urine:350; Blood:325]  BLOOD ADMINISTERED:none  DRAINS: (Medium) Hemovact drain(s) in the epidural space with  Suction Open   LOCAL MEDICATIONS USED:  MARCAINE    and LIDOCAINE   SPECIMEN:  No Specimen  DISPOSITION OF SPECIMEN:  N/A  COUNTS:  YES  TOURNIQUET:  * No tourniquets in log *  DICTATION: DICTATION: Patient is a 70 year old with spondylosis , stenosis, spondylolisthesis, disc herniation and severe back and bilateral lower extremity pain at L4/5 levels of the lumbar spine. It was elected to take her to surgery for MASPLIF L 45 level with posterolateral arthrodesis.  Procedure:   Following uncomplicated induction of GETA, and placement of electrodes for neural monitoring, patient was turned into a prone position on the [place] tableand using AP  fluoroscopy the area of planned incision was marked, prepped with betadine scrub and Duraprep, then draped. Exposure was performed of facet joint complex at L 45 level and the MAS retractor was placed.5.5 x 35 mm cortical Nuvasive screws were placed at L 4 bilaterally according to standard landmarks using neural monitoring.  A total laminectomy of L 4 was then performed with  disarticulation of facets.  Decompression was greater than for standard PLIF procedure.  The ligamentous tissue was densely adherent and was carefully dissected from the thecal sac, L 4 and L 5 nerve roots.  This bone was saved for grafting, combined with allograft after being run through bone mill and was placed in bone packing device.  Thorough discectomy was performed bilaterally at L 45 and the endplates were prepared for grafting.  23 x 10 x 12 degree cages were placed in the interspace and positioning was confirmed with AP and lateral fluoroscopy.  10 cc of autograft/allograft was packed in the interspace medial to the second cage.   Remaining screws were placed at L 5 (5.5 x 35 mm) and 30 mm rods were placed.   And the screws were locked and torqued.Final Xrays showed well positioned implants and screw fixation. The posterolateral region was packed with remaining 10 cc of autograft on each side of midline. A Medium Hemovac drain was placed.  The wounds were irrigated and then closed with 1, 2-0 and 3-0 Vicryl stitches. Sterile occlusive dressing was placed with Dermabond. The patient was then extubated in the operating room and taken to recovery in stable and satisfactory condition having tolerated her operation well. Counts were correct at the end of the case.  PLAN OF CARE: Admit to inpatient   PATIENT DISPOSITION:  PACU - hemodynamically stable.   Delay start of Pharmacological VTE agent (>24hrs) due to surgical blood loss or risk of bleeding: yes

## 2015-03-18 NOTE — Anesthesia Preprocedure Evaluation (Addendum)
Anesthesia Evaluation  Patient identified by MRN, date of birth, ID band Patient awake    Reviewed: Allergy & Precautions, H&P , NPO status , Patient's Chart, lab work & pertinent test results, reviewed documented beta blocker date and time   History of Anesthesia Complications Negative for: history of anesthetic complications  Airway Mallampati: II  TM Distance: >3 FB Neck ROM: full    Dental  (+) Dental Advisory Given, Teeth Intact   Pulmonary sleep apnea (does not tolerate CPAP, uses night time O2) , former smoker,    Pulmonary exam normal breath sounds clear to auscultation       Cardiovascular hypertension, Pt. on medications + CAD  Normal cardiovascular exam Rhythm:regular Rate:Normal  Has cardiology clearance  2015 with low risk stress test  Echo : EF 55%   Neuro/Psych negative neurological ROS     GI/Hepatic Neg liver ROS,   Endo/Other  diabetes, Poorly Controlled, Type 2Hypothyroidism Morbid obesity  Renal/GU negative Renal ROS     Musculoskeletal  (+) Arthritis ,   Abdominal   Peds  Hematology negative hematology ROS (+)   Anesthesia Other Findings   Reproductive/Obstetrics negative OB ROS                           Anesthesia Physical Anesthesia Plan  ASA: III  Anesthesia Plan: General   Post-op Pain Management:    Induction: Intravenous  Airway Management Planned: Oral ETT  Additional Equipment:   Intra-op Plan:   Post-operative Plan: Possible Post-op intubation/ventilation  Informed Consent: I have reviewed the patients History and Physical, chart, labs and discussed the procedure including the risks, benefits and alternatives for the proposed anesthesia with the patient or authorized representative who has indicated his/her understanding and acceptance.   Dental Advisory Given  Plan Discussed with: Anesthesiologist and CRNA  Anesthesia Plan Comments:          Anesthesia Quick Evaluation

## 2015-03-18 NOTE — Transfer of Care (Signed)
Immediate Anesthesia Transfer of Care Note  Patient: Shelley Butler  Procedure(s) Performed: Procedure(s): Lumbar four-five Maximum access posterior lumbar interbody fusion (N/A)  Patient Location: PACU  Anesthesia Type:General  Level of Consciousness: sedated and responds to stimulation  Airway & Oxygen Therapy: Patient Spontanous Breathing and Patient connected to face mask oxygen  Post-op Assessment: Report given to RN and Post -op Vital signs reviewed and stable  Post vital signs: Reviewed and stable  Last Vitals:  Filed Vitals:   03/18/15 0904 03/18/15 1652  BP: 147/71 132/53  Pulse: 73 78  Temp: 36.4 C   Resp:  15    Complications: No apparent anesthesia complications

## 2015-03-18 NOTE — Anesthesia Postprocedure Evaluation (Signed)
Anesthesia Post Note  Patient: Shelley Butler  Procedure(s) Performed: Procedure(s) (LRB): Lumbar four-five Maximum access posterior lumbar interbody fusion (N/A)  Patient location during evaluation: PACU Anesthesia Type: General Level of consciousness: sedated Pain management: pain level controlled Vital Signs Assessment: post-procedure vital signs reviewed and stable Respiratory status: spontaneous breathing and respiratory function stable Cardiovascular status: stable Anesthetic complications: no    Last Vitals:  Filed Vitals:   03/18/15 1730 03/18/15 1800  BP: 118/63 135/78  Pulse: 78 83  Temp:    Resp:      Last Pain:  Filed Vitals:   03/18/15 1811  PainSc: 5                  Narada Uzzle DANIEL

## 2015-03-18 NOTE — Anesthesia Procedure Notes (Signed)
Procedure Name: Intubation Date/Time: 03/18/2015 2:37 PM Performed by: Daiva Eves Pre-anesthesia Checklist: Patient identified, Timeout performed, Emergency Drugs available, Suction available and Patient being monitored Patient Re-evaluated:Patient Re-evaluated prior to inductionOxygen Delivery Method: Circle system utilized Preoxygenation: Pre-oxygenation with 100% oxygen Intubation Type: IV induction Ventilation: Mask ventilation without difficulty Laryngoscope Size: Mac and 3 Grade View: Grade III Tube type: Oral Tube size: 7.5 mm Number of attempts: 2 Airway Equipment and Method: Stylet Placement Confirmation: ETT inserted through vocal cords under direct vision,  breath sounds checked- equal and bilateral,  positive ETCO2 and CO2 detector Secured at: 22 cm Tube secured with: Tape Dental Injury: Teeth and Oropharynx as per pre-operative assessment  Comments: Prominent front teeth. Arytenoid view only. DL x1- esoph intub. DL again- [id number].

## 2015-03-18 NOTE — Brief Op Note (Signed)
03/18/2015  4:54 PM  PATIENT:  Shelley Butler  70 y.o. 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, stenosis, spondylosis, radiculopathy, morbid obesity, diabetes  PROCEDURE:  Procedure(s): Lumbar four-five Maximum access posterior lumbar interbody fusion (N/A) with PEEK cages, autograft, allograft, pedicle screws, posterolateral arthrodesis  Decompression greater than for standard PLIF surgery   SURGEON:  Surgeon(s) and Role:    * Federick Levene, MD - Primary    * Henry Elsner, MD - Assisting  PHYSICIAN ASSISTANT:   ASSISTANTS: Poteat, RN   ANESTHESIA:   general  EBL:  Total I/O In: 1700 [I.V.:1700] Out: 675 [Urine:350; Blood:325]  BLOOD ADMINISTERED:none  DRAINS: (Medium) Hemovact drain(s) in the epidural space with  Suction Open   LOCAL MEDICATIONS USED:  MARCAINE    and LIDOCAINE   SPECIMEN:  No Specimen  DISPOSITION OF SPECIMEN:  N/A  COUNTS:  YES  TOURNIQUET:  * No tourniquets in log *  DICTATION: DICTATION: Patient is a 70-year-old with spondylosis , stenosis, spondylolisthesis, disc herniation and severe back and bilateral lower extremity pain at L4/5 levels of the lumbar spine. It was elected to take her to surgery for MASPLIF L 45 level with posterolateral arthrodesis.  Procedure:   Following uncomplicated induction of GETA, and placement of electrodes for neural monitoring, patient was turned into a prone position on the Jackson tableand using AP  fluoroscopy the area of planned incision was marked, prepped with betadine scrub and Duraprep, then draped. Exposure was performed of facet joint complex at L 45 level and the MAS retractor was placed.5.5 x 35 mm cortical Nuvasive screws were placed at L 4 bilaterally according to standard landmarks using neural monitoring.  A total laminectomy of L 4 was then performed with  disarticulation of facets.  Decompression was greater than for standard PLIF procedure.  The ligamentous tissue was densely adherent and was carefully dissected from the thecal sac, L 4 and L 5 nerve roots.  This bone was saved for grafting, combined with allograft after being run through bone mill and was placed in bone packing device.  Thorough discectomy was performed bilaterally at L 45 and the endplates were prepared for grafting.  23 x 10 x 12 degree cages were placed in the interspace and positioning was confirmed with AP and lateral fluoroscopy.  10 cc of autograft/allograft was packed in the interspace medial to the second cage.   Remaining screws were placed at L 5 (5.5 x 35 mm) and 30 mm rods were placed.   And the screws were locked and torqued.Final Xrays showed well positioned implants and screw fixation. The posterolateral region was packed with remaining 10 cc of autograft on each side of midline. A Medium Hemovac drain was placed.  The wounds were irrigated and then closed with 1, 2-0 and 3-0 Vicryl stitches. Sterile occlusive dressing was placed with Dermabond. The patient was then extubated in the operating room and taken to recovery in stable and satisfactory condition having tolerated her operation well. Counts were correct at the end of the case.  PLAN OF CARE: Admit to inpatient   PATIENT DISPOSITION:  PACU - hemodynamically stable.   Delay start of Pharmacological VTE agent (>24hrs) due to surgical blood loss or risk of bleeding: yes  

## 2015-03-18 NOTE — Interval H&P Note (Signed)
History and Physical Interval Note:  03/18/2015 7:29 AM  Shelley Butler  has presented today for surgery, with the diagnosis of Spondylolisthesis  The various methods of treatment have been discussed with the patient and family. After consideration of risks, benefits and other options for treatment, the patient has consented to  Procedure(s) with comments: L4-5 Maximum access posterior lumbar interbody fusion (N/A) - L4-5 Maximum access posterior lumbar interbody fusion as a surgical intervention .  The patient's history has been reviewed, patient examined, no change in status, stable for surgery.  I have reviewed the patient's chart and labs.  Questions were answered to the patient's satisfaction.     Shelley Butler D

## 2015-03-19 LAB — GLUCOSE, CAPILLARY
GLUCOSE-CAPILLARY: 139 mg/dL — AB (ref 65–99)
Glucose-Capillary: 154 mg/dL — ABNORMAL HIGH (ref 65–99)
Glucose-Capillary: 128 mg/dL — ABNORMAL HIGH (ref 65–99)
Glucose-Capillary: 127 mg/dL — ABNORMAL HIGH (ref 65–99)

## 2015-03-19 LAB — HEMOGLOBIN A1C
Hgb A1c MFr Bld: 9.7 % — ABNORMAL HIGH (ref 4.8–5.6)
Mean Plasma Glucose: 232 mg/dL

## 2015-03-19 MED ORDER — OXYCODONE HCL 5 MG PO TABS
10.0000 mg | ORAL_TABLET | ORAL | Status: DC | PRN
  Administered 2015-03-19 – 2015-03-22 (×3): 10 mg via ORAL
  Filled 2015-03-19 (×5): qty 2

## 2015-03-19 NOTE — Evaluation (Signed)
Physical Therapy Evaluation Patient Details Name: Shelley Butler MRN: [id number] DOB: 18-Sep-1945 Today's Date: 03/19/2015   History of Present Illness  70 y.o. female s/p L4-5 PLIF. PMH significant for DM, HTN, thyroid siease, and high cholesterol.  Clinical Impression  Patient presents with problems listed below.  Will benefit from acute PT to maximize functional independence prior to discharge home.  Patient will need to function at Mod I level to return home safely with prn assist from sister.  Recommend HHPT f/u for continued therapy at discharge.    Follow Up Recommendations Home health PT;Supervision for mobility/OOB    Equipment Recommendations  Rolling walker with 5" wheels;3in1 (PT)    Recommendations for Other Services       Precautions / Restrictions Precautions Precautions: Back Precaution Booklet Issued: Yes (comment) Precaution Comments: Reviewed back precautions and mobility information with patient, daughter, and son-in-law Required Braces or Orthoses: Spinal Brace Spinal Brace: Lumbar corset;Applied in sitting position Restrictions Weight Bearing Restrictions: No      Mobility  Bed Mobility Overal bed mobility: Needs Assistance Bed Mobility: Rolling;Sidelying to Sit;Sit to Sidelying Rolling: Min guard Sidelying to sit: Min guard     Sit to sidelying: Min assist General bed mobility comments: Verbal cues for technique to maintain back precautions.  Required increased time for mobility.  Assist to bring LE's onto bed to return to sidelying.  Transfers Overall transfer level: Needs assistance Equipment used: Rolling walker (2 wheeled) Transfers: Sit to/from Stand Sit to Stand: Min guard         General transfer comment: Patient able to don/doff brace with min assist.  Verbal cues for hand placement and technique.  Assist for safety only - no physical assist required.  Ambulation/Gait Ambulation/Gait assistance: Min guard Ambulation Distance  (Feet): 200 Feet Assistive device: Rolling walker (2 wheeled) Gait Pattern/deviations: Step-through pattern;Decreased stride length Gait velocity: decreased Gait velocity interpretation: Below normal speed for age/gender General Gait Details: Verbal cues for safe use of RW.  Patient with slow, guarded gait pattern.  Good upright posture during gait.  Stairs            Wheelchair Mobility    Modified Rankin (Stroke Patients Only)       Balance Overall balance assessment: Needs assistance Sitting-balance support: No upper extremity supported;Feet supported Sitting balance-Leahy Scale: Fair     Standing balance support: Single extremity supported;During functional activity Standing balance-Leahy Scale: Poor Standing balance comment: Requires at least 1 UE supported to maintain balance                             Pertinent Vitals/Pain Pain Assessment: 0-10 Pain Score: 5  Pain Location: Back Pain Descriptors / Indicators: Aching;Sore Pain Intervention(s): Monitored during session;Repositioned;Premedicated before session    Home Living Family/patient expects to be discharged to:: Private residence Living Arrangements: Other relatives (Sister) Available Help at Discharge: Family;Available PRN/intermittently Type of Home: House Home Access: Stairs to enter Entrance Stairs-Rails: None Entrance Stairs-Number of Steps: 2 Home Layout: Two level;Able to live on main level with bedroom/bathroom Home Equipment: Wheelchair - manual Additional Comments: Pt is concerned about her sister's reliability to assist her with daily tasks. Pt's daughter does not live nearby.    Prior Function Level of Independence: Independent               Hand Dominance   Dominant Hand: Right    Extremity/Trunk Assessment   Upper Extremity Assessment: Overall WFL for  tasks assessed           Lower Extremity Assessment: Generalized weakness      Cervical / Trunk  Assessment: Other exceptions  Communication   Communication: No difficulties  Cognition Arousal/Alertness: Awake/alert Behavior During Therapy: WFL for tasks assessed/performed Overall Cognitive Status: Within Functional Limits for tasks assessed                      General Comments General comments (skin integrity, edema, etc.): Hemovac in tact, surgical dressing clean and dry    Exercises        Assessment/Plan    PT Assessment Patient needs continued PT services  PT Diagnosis Difficulty walking;Generalized weakness;Acute pain   PT Problem List Decreased strength;Decreased balance;Decreased mobility;Decreased knowledge of use of DME;Decreased knowledge of precautions;Pain  PT Treatment Interventions DME instruction;Gait training;Stair training;Functional mobility training;Therapeutic activities;Patient/family education   PT Goals (Current goals can be found in the Care Plan section) Acute Rehab PT Goals Patient Stated Goal: to be able to walk without pain PT Goal Formulation: With patient/family Time For Goal Achievement: 03/26/15 Potential to Achieve Goals: Good    Frequency Min 5X/week   Barriers to discharge Decreased caregiver support Sister can provide prn assist    Co-evaluation               End of Session Equipment Utilized During Treatment: Gait belt;Back brace Activity Tolerance: Patient tolerated treatment well Patient left: in bed;with call bell/phone within reach;with family/visitor present Nurse Communication: Mobility status         Time: 1478-2956 PT Time Calculation (min) (ACUTE ONLY): 25 min   Charges:   PT Evaluation $PT Eval Moderate Complexity: 1 Procedure PT Treatments $Gait Training: 8-22 mins   PT G Codes:        Vena Austria 04-10-2015, 3:07 PM Durenda Hurt. Renaldo Fiddler, [facility] Acute Rehab Services Pager [phone]

## 2015-03-19 NOTE — Progress Notes (Signed)
Immediately postop, patiet MAEW with good PF/DF/EHL.  Doing well.

## 2015-03-19 NOTE — Progress Notes (Signed)
Occupational Therapy Evaluation Patient Details Name: Shelley Butler MRN: [id number] DOB: [birth year] Today's Date: 03/19/2015    History of Present Illness 70 y.o. female s/p L4-5 PLIF. PMH significant for DM, HTN, thyroid siease, and high cholesterol.   Clinical Impression   PTA, pt was independent with ADLs and occasionally using wheelchair-type device for mobility. Pt required min guard assist for all functional transfers and min-mod assist for LB ADLs this session. Began education on back precautions, brace wear protocol, compensatory strategies for ADLs, and availability of AE. Pt plans to d/c home with intermittent assistance from sister although pt is concerned about her sister's reliability to assist her - therefore pt will need to be mod I in order to d/c home safely, but after pt's performance today I do not anticipate this being an issue. Pt will benefit from continued acute OT to increase independence and safety with ADLs and mobility to allow for safe discharge home. Recommending HHOT and 3in1 and RW for home use.     Follow Up Recommendations  Home health OT;Supervision/Assistance - 24 hour    Equipment Recommendations  3 in 1 bedside comode;Other (comment) (RW-2 wheeled)    Recommendations for Other Services       Precautions / Restrictions Precautions Precautions: Back Precaution Booklet Issued: Yes (comment) Precaution Comments: Reviewed all back precautions and handout with pt and daughter Required Braces or Orthoses: Spinal Brace Spinal Brace: Lumbar corset;Applied in sitting position Restrictions Weight Bearing Restrictions: No      Mobility Bed Mobility Overal bed mobility: Needs Assistance Bed Mobility: Rolling;Sidelying to Sit Rolling: Min guard Sidelying to sit: Min guard       General bed mobility comments: HOB flat, use of bedrails. Verbal cues for log roll technique and pt with good return demonstration.   Transfers Overall transfer level:  Needs assistance Equipment used: Rolling walker (2 wheeled) Transfers: Sit to/from Stand Sit to Stand: Min guard         General transfer comment: Min guard for safety. Verbal cues for safe hand placement on seated surfaces. No physical assist required, no overt LOB or reports of dizziness or lightheadedness    Balance Overall balance assessment: Needs assistance Sitting-balance support: No upper extremity supported;Feet supported Sitting balance-Leahy Scale: Fair     Standing balance support: Single extremity supported;During functional activity Standing balance-Leahy Scale: Poor Standing balance comment: Requires at least 1 UE supported to maintain balance                            ADL Overall ADL's : Needs assistance/impaired     Grooming: Wash/dry hands;Wash/dry face;Oral care;Min guard;Cueing for compensatory techniques;Standing Grooming Details (indicate cue type and reason): Educated on 2 cup method for oral care         Upper Body Dressing : Minimal assistance;Cueing for sequencing;Sitting Upper Body Dressing Details (indicate cue type and reason): To don back brace Lower Body Dressing: Moderate assistance;Cueing for compensatory techniques;Adhering to back precautions;Sit to/from stand Lower Body Dressing Details (indicate cue type and reason): Unable to cross ankle-over-knee to reach LB Toilet Transfer: Min guard;Cueing for safety;Ambulation;RW Toilet Transfer Details (indicate cue type and reason): Simulated to chair. Verbal cues to feel surfaces on back of legs before reaching back and attempting to sit         Functional mobility during ADLs: Min guard;Rolling walker General ADL Comments: Began education on back precautions, brace wear protocol, compensatory strategies for ADLs, safe DME use,  and availability of AE. Pt's daughter and son-in-law present for OT eval.     Vision Vision Assessment?: No apparent visual deficits   Perception      Praxis      Pertinent Vitals/Pain Pain Assessment: 0-10 Pain Score: 3  Pain Location: Back Pain Descriptors / Indicators: Aching;Sore Pain Intervention(s): Limited activity within patient's tolerance;Monitored during session;Premedicated before session;Repositioned     Hand Dominance Right   Extremity/Trunk Assessment Upper Extremity Assessment Upper Extremity Assessment: Overall WFL for tasks assessed   Lower Extremity Assessment Lower Extremity Assessment: Overall WFL for tasks assessed   Cervical / Trunk Assessment Cervical / Trunk Assessment: Other exceptions Cervical / Trunk Exceptions: s/p lumbar surgery   Communication Communication Communication: No difficulties   Cognition Arousal/Alertness: Awake/alert Behavior During Therapy: WFL for tasks assessed/performed Overall Cognitive Status: Within Functional Limits for tasks assessed                     General Comments       Exercises       Shoulder Instructions      Home Living Family/patient expects to be discharged to:: Private residence Living Arrangements: Other relatives (sister) Available Help at Discharge: Family;Available PRN/intermittently Type of Home: House Home Access: Stairs to enter [employer] of Steps: 2 Entrance Stairs-Rails: None Home Layout: Two level;Able to live on main level with bedroom/bathroom     Bathroom Shower/Tub: Tub/shower unit;Curtain Shower/tub characteristics: Engineer, building services: Standard Bathroom Accessibility: Yes How Accessible: Accessible via walker Home Equipment: Wheelchair - manual   Additional Comments: Pt is concerned about her sister's reliability to assist her with daily tasks. Pt's daughter does not live nearby.      Prior Functioning/Environment Level of Independence: Independent             OT Diagnosis: Acute pain   OT Problem List: Decreased strength;Decreased range of motion;Decreased activity tolerance;Impaired  balance (sitting and/or standing);Decreased coordination;Decreased safety awareness;Decreased knowledge of use of DME or AE;Decreased knowledge of precautions;Obesity;Pain   OT Treatment/Interventions: Self-care/ADL training;Therapeutic exercise;Energy conservation;DME and/or AE instruction;Therapeutic activities;Patient/family education;Balance training    OT Goals(Current goals can be found in the care plan section) Acute Rehab OT Goals Patient Stated Goal: to be able to walk without pain OT Goal Formulation: With patient Time For Goal Achievement: 04/02/15 Potential to Achieve Goals: Good ADL Goals Pt Will Perform Lower Body Bathing: with supervision;with adaptive equipment;sitting/lateral leans;sit to/from stand Pt Will Perform Lower Body Dressing: with supervision;with adaptive equipment;sitting/lateral leans;sit to/from stand Pt Will Transfer to Toilet: with supervision;ambulating;bedside commode (with BSC over toilet) Pt Will Perform Toileting - Clothing Manipulation and hygiene: with supervision;with adaptive equipment;sitting/lateral leans;sit to/from stand Pt Will Perform Tub/Shower Transfer: Tub transfer;with supervision;ambulating;shower seat;rolling walker Additional ADL Goal #1: Pt will independently don/doff back brace to increase independence with ADLs and mobility. Additional ADL Goal #2: Pt will demonstrate adherence to 3/3 back precautions with no verbal cues to increase safety with ADLs and mobility.  OT Frequency: Min 3X/week   Barriers to D/C: Decreased caregiver support  Pt's sister is unreliable per pt report       Co-evaluation              End of Session Equipment Utilized During Treatment: Gait belt;Rolling walker;Back brace Nurse Communication: Mobility status;Precautions  Activity Tolerance: Patient tolerated treatment well Patient left: in chair;with call bell/phone within reach;with family/visitor present   Time: 7829-5621 OT Time Calculation  (min): 47 min Charges:  OT General Charges $OT Visit: 1  Procedure OT Evaluation $OT Eval Moderate Complexity: 1 Procedure OT Treatments $Self Care/Home Management : 23-37 mins G-Codes:    Nils Pyle, OTR/L Pager: [phone] 03/19/2015, 12:16 PM

## 2015-03-19 NOTE — Progress Notes (Signed)
Patient ID: Shelley Butler, female   DOB: [birth year], 70 y.o.   MRN: [id number] Stable, c/o incisional pain. Pt to see.

## 2015-03-19 NOTE — Progress Notes (Signed)
Utilization review completed.  

## 2015-03-20 LAB — GLUCOSE, CAPILLARY
GLUCOSE-CAPILLARY: 126 mg/dL — AB (ref 65–99)
Glucose-Capillary: 172 mg/dL — ABNORMAL HIGH (ref 65–99)
Glucose-Capillary: 123 mg/dL — ABNORMAL HIGH (ref 65–99)
Glucose-Capillary: 184 mg/dL — ABNORMAL HIGH (ref 65–99)

## 2015-03-20 NOTE — Progress Notes (Signed)
Physical Therapy Treatment Patient Details Name: Shelley Butler MRN: [id number] DOB: [birth year] Today's Date: 03/20/2015    History of Present Illness 70 y.o. female s/p L4-5 PLIF. PMH significant for DM, HTN, thyroid siease, and high cholesterol.    PT Comments    Pt progressing well. She is anxious about managing at home. Reassured her that she is doing well with mobility.  Follow Up Recommendations  Home health PT;Supervision for mobility/OOB     Equipment Recommendations  Rolling walker with 5" wheels;3in1 (PT)    Recommendations for Other Services       Precautions / Restrictions Precautions Precautions: Back Precaution Comments: Reviewed back precautions. Pt independently recalled 3/3 precautions. Required Braces or Orthoses: Spinal Brace Spinal Brace: Lumbar corset;Applied in sitting position    Mobility  Bed Mobility     Rolling: Min guard Sidelying to sit: Min guard;HOB elevated       General bed mobility comments: Verbal cues for sequencing. Use of bed rail. Increased time required to complete.  Transfers   Equipment used: Rolling walker (2 wheeled)   Sit to Stand: Min assist         General transfer comment: Verbal cues for hand placement. Assist to power up.  Ambulation/Gait Ambulation/Gait assistance: Min guard Ambulation Distance (Feet): 225 Feet Assistive device: Rolling walker (2 wheeled) Gait Pattern/deviations: Step-through pattern;Decreased stride length Gait velocity: decreased Gait velocity interpretation: Below normal speed for age/gender General Gait Details: slow, steady gait   Stairs            Wheelchair Mobility    Modified Rankin (Stroke Patients Only)       Balance                                    Cognition Arousal/Alertness: Awake/alert Behavior During Therapy: WFL for tasks assessed/performed Overall Cognitive Status: Within Functional Limits for tasks assessed                      Exercises      General Comments        Pertinent Vitals/Pain Pain Assessment: 0-10 Pain Score: 4  Pain Location: low back Pain Descriptors / Indicators: Sore Pain Intervention(s): Monitored during session;Repositioned;Premedicated before session    Home Living                      Prior Function            PT Goals (current goals can now be found in the care plan section) Acute Rehab PT Goals Patient Stated Goal: to be able to walk without pain PT Goal Formulation: With patient/family Time For Goal Achievement: 03/26/15 Potential to Achieve Goals: Good Progress towards PT goals: Progressing toward goals    Frequency  Min 5X/week    PT Plan Current plan remains appropriate    Co-evaluation             End of Session Equipment Utilized During Treatment: Gait belt;Back brace Activity Tolerance: Patient tolerated treatment well Patient left: in chair;with call bell/phone within reach     Time: 0857-0921 PT Time Calculation (min) (ACUTE ONLY): 24 min  Charges:  $Gait Training: 23-37 mins                    G Codes:      Ilda Foil 03/20/2015, 9:21 AM

## 2015-03-20 NOTE — Progress Notes (Signed)
Patient ID: Shelley Butler, female   DOB: July 15, 1945, 70 y.o.   MRN: [id number] Vital signs are stable Motor function appears to be intact Patient exhibits substantial soreness in her back and in her legs Does not feel ready for discharge today

## 2015-03-20 NOTE — Progress Notes (Signed)
Pt complains of expiratory wheezing. Assessed patient's lungs and confirmed bilateral expiratory wheezing. Notified RT to give patient nebulizer treatment.

## 2015-03-20 NOTE — Progress Notes (Signed)
OT Cancellation Note  Patient Details Name: Shelley Butler MRN: [id number] DOB: November 18, 1945   Cancelled Treatment:    Reason Eval/Treat Not Completed: Fatigue/lethargy limiting ability to participate.  Pt. Declines due to having already completed PT and just finished a full bath.  Will check back as time permits.    Robet Leu, COTA/L 03/20/2015, 11:34 AM

## 2015-03-21 ENCOUNTER — Encounter (HOSPITAL_COMMUNITY): Payer: Self-pay | Admitting: Neurosurgery

## 2015-03-21 LAB — GLUCOSE, CAPILLARY
GLUCOSE-CAPILLARY: 123 mg/dL — AB (ref 65–99)
GLUCOSE-CAPILLARY: 84 mg/dL (ref 65–99)
GLUCOSE-CAPILLARY: 191 mg/dL — AB (ref 65–99)
GLUCOSE-CAPILLARY: 79 mg/dL (ref 65–99)
Glucose-Capillary: 99 mg/dL (ref 65–99)

## 2015-03-21 NOTE — Care Management Note (Signed)
Case Management Note  Patient Details  Name: Shelley Butler MRN: [id number] Date of Birth: Apr 10, 1945  Subjective/Objective:                    Action/Plan: Plan is for patient to discharge home tomorrow with [facility] services. CM met with the patient and provided her a list of agencies in the [place] area. She selected [facility]. Cindy with [facility] notified and information requested was faxed to the number provided. Patient also ordered a rolling walker. Jermaine with [facility] notified and will deliver the equipment to the room.   Expected Discharge Date:                  Expected Discharge Plan:  [place]  In-House Referral:     Discharge planning Services  CM Consult  Post Acute Care Choice:  Durable Medical Equipment, Home Health Choice offered to:  Patient  DME Arranged:  Walker rolling DME Agency:  [facility] Arranged:  PT, OT HH Agency:  [facility]  Status of Service:  Completed, signed off  Medicare Important Message Given:  Yes Date Medicare IM Given:    Medicare IM give by:    Date Additional Medicare IM Given:    Additional Medicare Important Message give by:     If discussed at [place] of Stay Meetings, dates discussed:    Additional Comments:  Pollie Friar, RN 03/21/2015, 2:41 PM

## 2015-03-21 NOTE — Care Management Important Message (Signed)
Important Message  Patient Details  Name: Shelley Butler MRN: [id number] Date of Birth: 25-Nov-1945   Medicare Important Message Given:  Yes    Bernadette Hoit 03/21/2015, 11:25 AM

## 2015-03-21 NOTE — Progress Notes (Signed)
Subjective: Patient reports "I feel ok "  Objective: Vital signs in last 24 hours: Temp:  [97.3 F (36.3 C)-99.5 F (37.5 C)] 97.3 F (36.3 C) (02/20 0609) Pulse Rate:  [73-93] 73 (02/20 0609) Resp:  [15-20] 18 (02/20 0609) BP: (101-122)/(51-63) 122/57 mmHg (02/20 0609) SpO2:  [88 %-99 %] 93 % (02/20 0609)  Intake/Output from previous day: 02/19 0701 - 02/20 0700 In: -  Out: 127 [Urine:2; Drains:125] Intake/Output this shift:    Alert, conversant, up in chair for breakfast. Denies pain at present, noting only one Percocet needed last night. Incision withut erythema, swelling, or drainage beneath honeycomb & Dermabond. Hemovac patent, dark, with nothing in reservoir.  Good strength BLE.   Lab Results: No results for input(s): WBC, HGB, HCT, PLT in the last 72 hours. BMET No results for input(s): NA, K, CL, CO2, GLUCOSE, BUN, CREATININE, CALCIUM in the last 72 hours.  Studies/Results: No results found.  Assessment/Plan: Improving   LOS: 3 days  Pt states she is unable to go home today as accomodations need to be made. Per DrStern, d/c Hemovac. Continue to mobilize in LSO.    Georgiann Cocker 03/21/2015, 8:01 AM

## 2015-03-21 NOTE — Clinical Social Work Note (Signed)
CSW received referral for SNF.  Case discussed with case manager, and plan is to discharge home with home health.  CSW to sign off please re-consult if social work needs arise.  Shamell Hittle R. Zeki Bedrosian, MSW, LCSWA [phone]  

## 2015-03-21 NOTE — Progress Notes (Signed)
Occupational Therapy Treatment Patient Details Name: Shelley Butler MRN: [id number] DOB: [birth year] Today's Date: 03/21/2015    History of present illness 70 y.o. female s/p L4-5 PLIF. PMH significant for DM, HTN, thyroid siease, and high cholesterol.   OT comments  Patient progressing towards OT goals, continue plan of care for now. Pt refused transfer with this therapist secondary to complaints of a headache. Therapist educated patient on use of AE to increase independence with LB ADLs.    Follow Up Recommendations  Home health OT;Supervision/Assistance - 24 hour    Equipment Recommendations  3 in 1 bedside comode;Other (comment) (RW and AE - reahcer, sock aid, LH sponge, LH shoe horn)    Recommendations for Other Services  None at this time   Precautions / Restrictions Precautions Precautions: Back Precaution Comments: Patient able to describe back precautions with min VCs, no cues needed to adhere Required Braces or Orthoses: Spinal Brace Spinal Brace: Lumbar corset;Applied in sitting position Restrictions Weight Bearing Restrictions: No    Mobility Bed Mobility Overal bed mobility: Needs Assistance Bed Mobility: Rolling;Sidelying to Sit Rolling: Modified independent (Device/Increase time) Sidelying to sit: Min guard       General bed mobility comments: HOB flat, minimal use of bed rail, increased time  Transfers Overall transfer level: Needs assistance Equipment used: Rolling walker (2 wheeled) Transfers: Sit to/from Stand Sit to Stand: Min guard         General transfer comment: did not occur, pt refused, stating her head was bothering her too much    Balance Overall balance assessment: Needs assistance Sitting-balance support: No upper extremity supported;Feet supported Sitting balance-Leahy Scale: Good Sitting balance - Comments: Pt seated EOB using AE for LB ADLs    Standing balance support: No upper extremity supported Standing balance-Leahy Scale:  Fair Standing balance comment: stood at sink without UE support; stood on steps without support   ADL Overall ADL's : Needs assistance/impaired Lower Body Bathing: Supervison/ safety;Sit to/from stand;Cueing for safety;Cueing for back precautions;With adaptive equipment       Lower Body Dressing: Supervision/safety;Sit to/from stand;Cueing for safety;Cueing for sequencing;Cueing for back precautions;With adaptive equipment General ADL Comments: Educated patient on using AE to assist and increase independence with LB ADLs. Pt refused any transfers due to complaints of a headache. Administerred handout on AE and encouraged pt to purchase. Would like to see patient again tomorrow prior to her discharging home.      Cognition   Behavior During Therapy: WFL for tasks assessed/performed Overall Cognitive Status: Within Functional Limits for tasks assessed                 Pertinent Vitals/ Pain       Pain Assessment: Faces Pain Score: 4  Faces Pain Scale: Hurts little more Pain Location: head Pain Descriptors / Indicators: Aching Pain Intervention(s): Limited activity within patient's tolerance;Monitored during session   Frequency Min 3X/week     Progress Toward Goals  OT Goals(current goals can now befound in the care plan section)  Progress towards OT goals: Progressing toward goals  Acute Rehab OT Goals Patient Stated Goal: go home tomorrow OT Goal Formulation: With patient Time For Goal Achievement: 04/02/15 Potential to Achieve Goals: Good  Plan Discharge plan remains appropriate    End of Session Equipment Utilized During Treatment: Other (comment) (AE)   Activity Tolerance Patient tolerated treatment well   Patient Left in bed;with call bell/phone within reach;with bed alarm set    Time: 4098-1191 OT Time Calculation (min): 13  min  Charges: OT General Charges $OT Visit: 1 Procedure OT Treatments $Self Care/Home Management : 8-22 mins  Edwin Cap , MS,  OTR/L, [place] Pager: [phone]  03/21/2015, 3:34 PM

## 2015-03-21 NOTE — Progress Notes (Signed)
Physical Therapy Treatment Patient Details Name: Shelley Butler MRN: [id number] DOB: 1945-07-27 Today's Date: 03/21/2015    History of Present Illness 70 y.o. female s/p L4-5 PLIF. PMH significant for DM, HTN, thyroid siease, and high cholesterol.    PT Comments    Patient is progressing well. Patient reported today that she has a very tall bed that she uses 2 steps to get into bed. Despite brainstorming, demonstrating and practicing we could not find a safe way for patient to ascend/descend these 2 steps up to her bed on her own (while maintaining back precautions). Therefore recommend hospital bed for patient.    Follow Up Recommendations  Home health PT;Supervision - Intermittent     Equipment Recommendations  Rolling walker with 5" wheels;3in1 (PT);Hospital bed (due to 2 steps to get up on very high bed; unable to safely get into her bed)    Recommendations for Other Services       Precautions / Restrictions Precautions Precautions: Back Precaution Comments: Patient able to describe back precautions, no cues needed to adhere Required Braces or Orthoses: Spinal Brace Spinal Brace: Lumbar corset;Applied in sitting position Restrictions Weight Bearing Restrictions: No    Mobility  Bed Mobility Overal bed mobility: Needs Assistance Bed Mobility: Rolling;Sidelying to Sit Rolling: Modified independent (Device/Increase time) Sidelying to sit: Min guard       General bed mobility comments: HOB flat, no rail; incr time and effort to come to sitting with incr twisting to allow pushing with both arms  Transfers Overall transfer level: Needs assistance Equipment used: Rolling walker (2 wheeled) Transfers: Sit to/from Stand Sit to Stand: Min guard         General transfer comment: Elevated bed to simulate her high bed at home (requires 2 steps to get up into); pt reports her sister will likely not help her and with multiple ideas we could not come up with a safe way for  pt to ascend/descend steps without bending; pt reports she has family members who can take down her bed and wants to have a hospital bed  Ambulation/Gait Ambulation/Gait assistance: Min guard Ambulation Distance (Feet): 200 Feet Assistive device: Rolling walker (2 wheeled) Gait Pattern/deviations: Step-through pattern;Decreased stride length Gait velocity: decreased   General Gait Details: Good upright posture; RW is too tall for her yet she accomodates well to use it safely.   Stairs Stairs: Yes Stairs assistance: Min assist Stair Management: No rails;Step to pattern;Forwards Number of Stairs: 3 General stair comments: pt began with one step with bil rails; progressed to 2 steps, no rails and steadying assist  Wheelchair Mobility    Modified Rankin (Stroke Patients Only)       Balance           Standing balance support: No upper extremity supported Standing balance-Leahy Scale: Fair Standing balance comment: stood at sink without UE support; stood on steps without support                    Cognition Arousal/Alertness: Awake/alert Behavior During Therapy: WFL for tasks assessed/performed Overall Cognitive Status: Within Functional Limits for tasks assessed                      Exercises      General Comments        Pertinent Vitals/Pain Pain Assessment: 0-10 Pain Score: 4  Pain Location: back Pain Descriptors / Indicators: Operative site guarding Pain Intervention(s): Limited activity within patient's tolerance;Monitored during session;Premedicated before session;Repositioned  Home Living                      Prior Function            PT Goals (current goals can now be found in the care plan section) Acute Rehab PT Goals Patient Stated Goal: to be able to walk without pain Time For Goal Achievement: 03/26/15    Frequency  Min 5X/week    PT Plan Current plan remains appropriate    Co-evaluation             End  of Session Equipment Utilized During Treatment: Gait belt;Back brace Activity Tolerance: Patient tolerated treatment well Patient left: with call bell/phone within reach;in chair;with chair alarm set;with nursing/sitter in room     Time: 1118-1150 PT Time Calculation (min) (ACUTE ONLY): 32 min  Charges:  $Gait Training: 23-37 mins                    G Codes:      Shelley Butler 04/15/2015, 12:41 PM Pager [phone]

## 2015-03-21 NOTE — Care Management Note (Signed)
Case Management Note  Patient Details  Name: Shelley Butler MRN: [id number] Date of Birth: [birth year]  Subjective/Objective:                    Action/Plan: Patient was admitted for a Lumbar four-five Maximum access posterior lumbar interbody fusion.  Lives at home with sister. Will follow for discharge needs pending PT/OT evals and physician orders.  Expected Discharge Date:                  Expected Discharge Plan:     In-House Referral:     Discharge planning Services     Post Acute Care Choice:    Choice offered to:     DME Arranged:    DME Agency:     HH Arranged:    HH Agency:     Status of Service:  In process, will continue to follow  Medicare Important Message Given:  Yes Date Medicare IM Given:    Medicare IM give by:    Date Additional Medicare IM Given:    Additional Medicare Important Message give by:     If discussed at Long Length of Stay Meetings, dates discussed:    Additional Comments:  Anda Kraft, RN 03/21/2015, 11:47 AM [phone]

## 2015-03-22 LAB — GLUCOSE, CAPILLARY
Glucose-Capillary: 106 mg/dL — ABNORMAL HIGH (ref 65–99)
Glucose-Capillary: 188 mg/dL — ABNORMAL HIGH (ref 65–99)

## 2015-03-22 NOTE — Care Management Note (Signed)
Case Management Note  Patient Details  Name: MARYAMA KURIAKOSE MRN: [id number] Date of Birth: 1945-03-06  Subjective/Objective:                    Action/Plan: Orders placed this am for hospital bed and 3 in 1. CM spoke with Jermaine with [facility] and the equipment will be delivered to the room. CM spoke with Ms Conry and informed her about the equipment and that [facility] services are set up. Will update the bedside RN.   Expected Discharge Date:                  Expected Discharge Plan:  Home w Home Health Services  In-House Referral:     Discharge planning Services  CM Consult  Post Acute Care Choice:  Durable Medical Equipment, Home Health Choice offered to:  Patient  DME Arranged:  Walker rolling DME Agency:  [facility]  HH Arranged:  PT, OT HH Agency:  [facility]  Status of Service:  Completed, signed off  Medicare Important Message Given:  Yes Date Medicare IM Given:    Medicare IM give by:    Date Additional Medicare IM Given:    Additional Medicare Important Message give by:     If discussed at Long Length of Stay Meetings, dates discussed:    Additional Comments:  Kermit Balo, RN 03/22/2015, 10:50 AM

## 2015-03-22 NOTE — Progress Notes (Signed)
CM spoke with [facility] this am and they have accepted the referral for [facility] PT/OT services. Plan is for patient to discharge home today. Will updated the bedside RN.

## 2015-03-22 NOTE — Progress Notes (Signed)
Discharge orders received, Pt for discharge home today. IV d/c'd. D/c instructions and RX given with verbalized understanding. Family at bedside to assist patient with discharge. Staff bought pt downstairs via wheelchair. 03/22/15 1340

## 2015-03-22 NOTE — Progress Notes (Signed)
Occupational Therapy Treatment and Discharge Patient Details Name: Shelley Butler MRN: [id number] DOB: 14-Oct-1945 Today's Date: 03/22/2015    History of present illness 70 y.o. female s/p L4-5 PLIF. PMH significant for DM, HTN, thyroid siease, and high cholesterol.   OT comments  This 70 yo female admitted and underwent above presents to acute OT with all acute OT education completed and pt without further questions about basic ADLs. I noted that there was not an order written for a 3n1 even though we had originally recommended one, I contacted CM to ask her to add this to pt's DME list for home. We will D/C from acute OT with pt to D/C home today with HHOT.  Follow Up Recommendations  Home health OT;Supervision/Assistance - 24 hour    Equipment Recommendations  3 in 1 bedside comode;Other (comment) (pt to get AE on her own)       Precautions / Restrictions Precautions Precautions: Back Precaution Comments: Patient able state 3/3 back precautions Required Braces or Orthoses: Spinal Brace Spinal Brace: Lumbar corset;Applied in sitting position Restrictions Weight Bearing Restrictions: No       Mobility Bed Mobility Overal bed mobility: Modified Independent Bed Mobility: Rolling;Sidelying to Sit Rolling: Modified independent (Device/Increase time) (use of rail) Sidelying to sit: Modified independent (Device/Increase time) (use of rail)          Transfers Overall transfer level: Needs assistance Equipment used: Rolling walker (2 wheeled) Transfers: Sit to/from Stand Sit to Stand: Modified independent (Device/Increase time)                  ADL                         Lower Body Dressing Details (indicate cue type and reason): Pt reports she knows how to use AE and will be getting it before she goes home Toilet Transfer: Modified Independent;Ambulation;RW (bed>hallway to gym>back to sit on EOB)     Toileting - Clothing Manipulation Details (indicate  cue type and reason): I went over how she would use toilet tongs and had her return demo how to apply wet wipe to tongs, positioning of tongs for wiping, and how to remove wipe from tongs Tub/ Shower Transfer: Tub transfer;Min guard;Adhering to back precautions;Ambulation;Rolling walker;3 in 1 Tub/Shower Transfer Details (indicate cue type and reason): went over how someone would need to adjust shower head for her, that she needs to wear brace stepping in/out of tub, how to have someone adjust 3n1 so it will sit tub (2 legs in and 2 legs out), how to position shower curtain so that  water does not get out in floor                    Cognition   Behavior During Therapy: [facility] for tasks assessed/performed Overall Cognitive Status: Within Functional Limits for tasks assessed                                    Pertinent Vitals/ Pain       Pain Assessment: 0-10 Pain Score: 1  Pain Location: back Pain Descriptors / Indicators: Sore;Operative site guarding Pain Intervention(s): Monitored during session            Progress Toward Goals  OT Goals(current goals can now be found in the care plan section)  Progress towards OT goals:  (All acute OT education  completed)     Plan Discharge plan remains appropriate       End of Session Equipment Utilized During Treatment: Gait belt;Rolling walker;Back brace (toilet aid)   Activity Tolerance Patient tolerated treatment well   Patient Left  (sitting EOB getting ready to work with PT)           Time: 0102-7253 OT Time Calculation (min): 31 min  Charges: OT General Charges $OT Visit: 1 Procedure OT Treatments $Self Care/Home Management : 23-37 mins  Evette Georges [phone] 03/22/2015, 9:52 AM

## 2015-03-22 NOTE — Discharge Summary (Signed)
Physician Discharge Summary  Patient ID: Shelley Butler MRN: [id number] DOB/AGE: [birth year] 69 y.o.  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 Following uncomplicated L4-5 MAS PLIF, she recovered nicely and transferred to 38M for nursing care and therapies. She mobilized well.   Consults: None  Significant Diagnostic Studies: radiology: X-Ray: intra-op  Treatments: surgery: Lumbar four-five Maximum access posterior lumbar interbody fusion (N/A) with PEEK cages, autograft, allograft, pedicle screws, posterolateral arthrodesis   Discharge Exam: Blood pressure 120/62, pulse 77, temperature 98.8 F (37.1 C), temperature source Oral, resp. rate 19, height  (1.626 m), weight 102.059 kg (225 lb), SpO2 95 %. Awakens to voice. No report of pain at present. Good strength BLE. Reports one episode of leg numbness last night - reassured. incision without erythema, swelling, or drainage beneath honeycomb & Dermabond.    Disposition: Discharge to home. Hospital bed per PT recommendations. Rx's for Percocet& Robaxin for home use will be sent to her pharmacy electronically by DrStern. Pt agrees to call office to schedule 3-4 week f/u visit. She verbalizes understanding of d/c instructions.       Medication List    ASK your doctor about these medications        albuterol 108 (90 Base) MCG/ACT inhaler  Commonly  known as:  PROVENTIL HFA;VENTOLIN HFA  Inhale 1-2 puffs into the lungs every 6 (six) hours as needed for wheezing or shortness of breath.     aspirin EC 325 MG tablet  Take 325 mg by mouth daily.     gemfibrozil 600 MG tablet  Commonly known as:  LOPID  Take 600 mg by mouth 2 (two) times daily before a meal.     HUMALOG 100 UNIT/ML injection  Generic drug:  insulin lispro  Inject 20 Units into the skin 3 (three) times daily.     LANTUS 100 UNIT/ML injection  Generic drug:  insulin glargine  Inject 50 Units into the skin every evening.     levothyroxine 88 MCG tablet  Commonly known as:  SYNTHROID, LEVOTHROID  Take 88 mcg by mouth daily.     lisinopril-hydrochlorothiazide 20-12.5 MG tablet  Commonly known as:  PRINZIDE,ZESTORETIC  Take 1 tablet by mouth daily.     metFORMIN 1000 MG tablet  Commonly known as:  GLUCOPHAGE  Take 1,000 mg by mouth 2 (two) times daily.     metoprolol 50 MG tablet  Commonly known as:  LOPRESSOR  Take 50 mg by mouth 2 (two) times daily.     omeprazole 20 MG capsule  Commonly known as:  PRILOSEC  Take 20 mg by mouth daily.     pravastatin 40 MG tablet  Commonly known as:  PRAVACHOL  Take 40 mg by mouth daily.     Vitamin D (Ergocalciferol) 50000 units Caps capsule  Commonly known as:  DRISDOL  Take 50,000 Units by mouth every 7 (seven) days. Sunday         Signed: Georgiann Cocker 03/22/2015, 7:25 AM

## 2015-03-22 NOTE — Progress Notes (Signed)
Subjective: Patient reports "I'm doing ok"  Objective: Vital signs in last 24 hours: Temp:  [98.2 F (36.8 C)-98.9 F (37.2 C)] 98.8 F (37.1 C) (02/21 0647) Pulse Rate:  [73-80] 77 (02/21 0647) Resp:  [18-19] 19 (02/21 0647) BP: (92-129)/(43-63) 120/62 mmHg (02/21 0647) SpO2:  [94 %-97 %] 95 % (02/21 0647)  Intake/Output from previous day:   Intake/Output this shift:    Awakens to voice. No report of pain at present. Good strength BLE. Reports one episode of leg numbness last night - reassured. incision without erythema, swelling, or drainage beneath honeycomb & Dermabond.   Lab Results: No results for input(s): WBC, HGB, HCT, PLT in the last 72 hours. BMET No results for input(s): NA, K, CL, CO2, GLUCOSE, BUN, CREATININE, CALCIUM in the last 72 hours.  Studies/Results: No results found.  Assessment/Plan: Improved   LOS: 4 days  Per Drstern, d/c IV, d/c to home. Hospital bed per PT recommendations. Rx's for Percocet& Robaxin for home use will be sent to her pharmacy electronically by DrStern. Pt agrees to call office to schedule 3-4 week f/u visit. She verbalizes understanding of d/c instructions.    Georgiann Cocker 03/22/2015, 7:18 AM

## 2015-03-22 NOTE — Progress Notes (Signed)
Physical Therapy Treatment Patient Details Name: Shelley Butler MRN: [id number] DOB: 03-20-1945 Today's Date: 03/22/2015    History of Present Illness 70 y.o. female s/p L4-5 PLIF. PMH significant for DM, HTN, thyroid siease, and high cholesterol.    PT Comments    Patient just completing OT session on arrival. Focus of session on bed mobility and educating in proper use of hospital bed to assist without developing a dependence. Patient reported feeling very nauseous and assisted back to bed. Patient reported no further questions and feels OK re: discharge home today (from a mobility perspective).    Follow Up Recommendations  Home health PT;Supervision - Intermittent     Equipment Recommendations  Rolling walker with 5" wheels;3in1 (PT);Hospital bed (due to 2 steps to get up on very high bed; unable to safely)    Recommendations for Other Services       Precautions / Restrictions Precautions Precautions: Back Precaution Comments: Patient able to describe back precautions, no cues needed to adhere Required Braces or Orthoses: Spinal Brace Spinal Brace: Lumbar corset;Applied in sitting position Restrictions Weight Bearing Restrictions: No    Mobility  Bed Mobility Overal bed mobility: Needs Assistance Bed Mobility: Rolling;Sit to Sidelying Rolling: Modified independent (Device/Increase time)     Sit to sidelying: Min assist General bed mobility comments: HOB flat, no rail; incr effort to raise legs onto bed (including assist for second leg); vc to prevent twisting; educated re: use of hospital bed at home (start with HOB flat to roll, take legs off EOB, try to rise with use of rail and then if cannot, use control to partially elevate HOB; also educated on return to flat bed)  Transfers          General transfer comment: deferred as pt just finishing OT session and reported feeling nauseous and needing to lie down  Ambulation/Gait             General Gait  Details: deferred as pt just finishing OT session and reported feeling nauseous and needing to lie down; pt had just walked to gym with OT    Stairs           Wheelchair Mobility    Modified Rankin (Stroke Patients Only)       Balance             Standing balance-Leahy Scale: Fair                      Cognition Arousal/Alertness: Awake/alert Behavior During Therapy: WFL for tasks assessed/performed Overall Cognitive Status: Within Functional Limits for tasks assessed                      Exercises      General Comments        Pertinent Vitals/Pain Pain Assessment: 0-10 Pain Score: 1  Pain Location: back Pain Descriptors / Indicators: Operative site guarding Pain Intervention(s): Limited activity within patient's tolerance;Monitored during session;Repositioned    Home Living                      Prior Function            PT Goals (current goals can now be found in the care plan section) Acute Rehab PT Goals Patient Stated Goal: to be able to walk without pain Time For Goal Achievement: 03/26/15 Progress towards PT goals: Progressing toward goals (bed mobility)    Frequency  Min 5X/week  PT Plan Current plan remains appropriate    Co-evaluation             End of Session Equipment Utilized During Treatment: Back brace Activity Tolerance: Treatment limited secondary to medical complications (Comment) (nauseous; feels due to pain meds) Patient left: with call bell/phone within reach;in bed     Time: 4098-1191 PT Time Calculation (min) (ACUTE ONLY): 11 min  Charges:  $Therapeutic Activity: 8-22 mins                    G Codes:      Kaled Allende 27-Mar-2015, 10:28 AM Pager [phone]

## 2016-07-26 IMAGING — RF DG C-ARM 61-120 MIN
1 series · 2 of 2 positions shown · non-contrast
Comparison: None.

CLINICAL DATA: Spondylolisthesis.

EXAM:
LUMBAR SPINE - 2-3 VIEW; DG C-ARM 61-120 MIN

[Series 1: run · 2 of 2 slices shown]
[im 1/2]
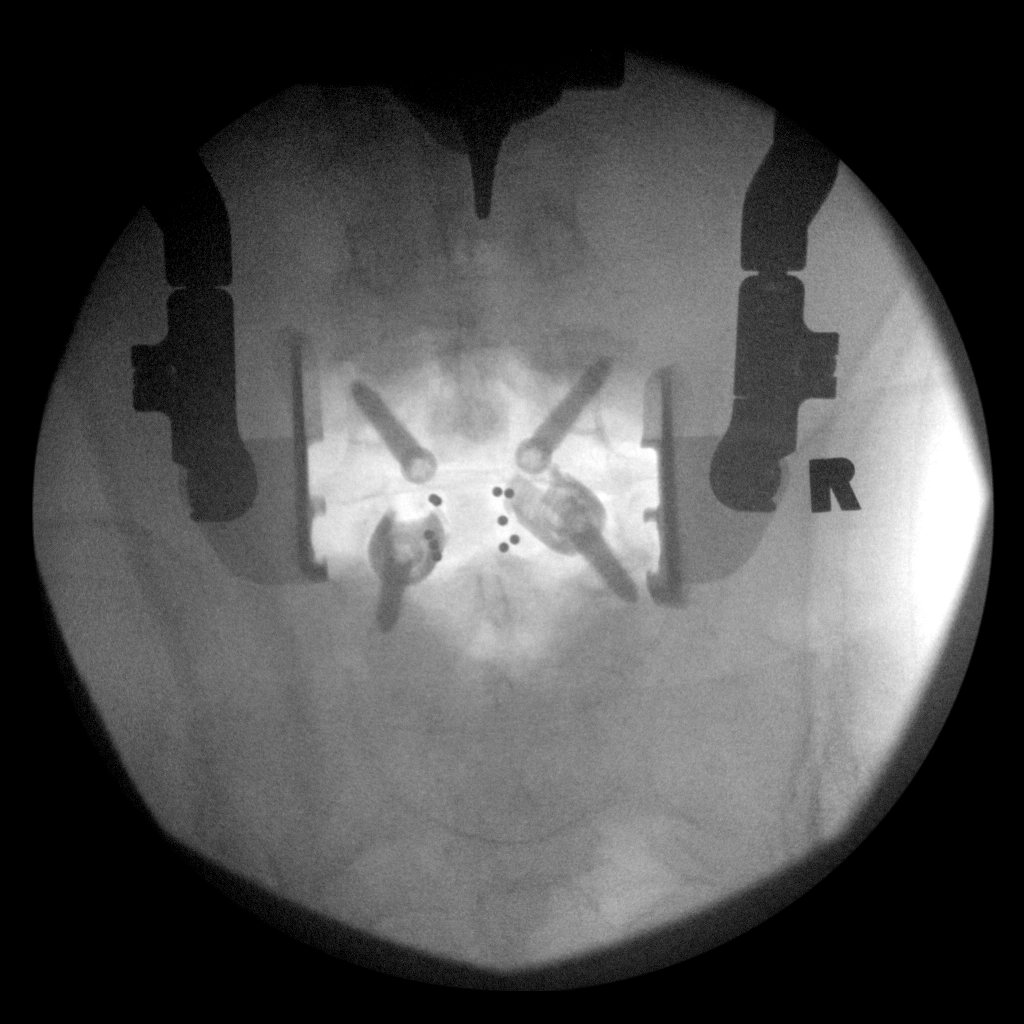
[im 2/2]
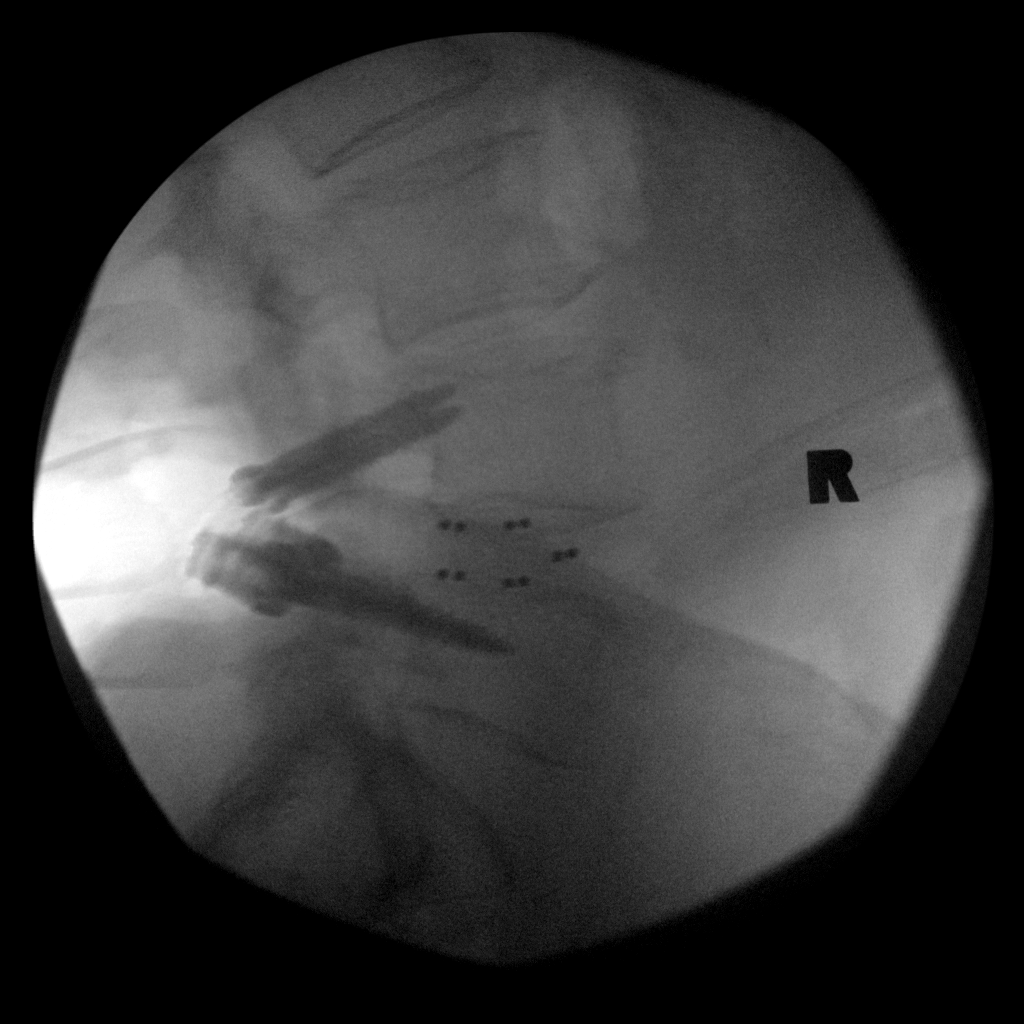

[2 of 2 positions shown; findings below may reference images not displayed]

FINDINGS: Two intraoperative fluoroscopic views of the lumbar spine
demonstrate bilateral pedicle screws at L4 and L5. Interbody device
at L4-L5.
IMPRESSION: Posterior lumbar interbody fusion at L4-L5.

## 2024-01-28 NOTE — Patient Instructions (Signed)
 SURGICAL WAITING ROOM VISITATION  Patients having surgery or a procedure may have no more than 2 support people in the waiting area - these visitors may rotate.    Children ages 19 and under will not be able to visit patients in [facility] under most circumstances.   Visitors with respiratory illnesses are discouraged from visiting and should remain at home.  If the patient needs to stay at the hospital during part of their recovery, the visitor guidelines for inpatient rooms apply. Pre-op nurse will coordinate an appropriate time for 1 support person to accompany patient in pre-op.  This support person may not rotate.    Please refer to the [facility] website for the visitor guidelines for Inpatients (after your surgery is over and you are in a regular room).       Your procedure is scheduled on:  02/04/2024    Report to [facility] Main Entrance    Report to admitting at  0930 AM   Call this number if you have problems the morning of surgery [phone]   Do not eat food :After Midnight.   After Midnight you may have the following liquids until __ 0900____ AM DAY OF SURGERY  Water Non-Citrus Juices (without pulp, NO RED-Apple, White grape, White cranberry) Black Coffee (NO MILK/CREAM OR CREAMERS, sugar ok)  Clear Tea (NO MILK/CREAM OR CREAMERS, sugar ok) regular and decaf                             Plain Jell-O (NO RED)                                           Fruit ices (not with fruit pulp, NO RED)                                     Popsicles (NO RED)                                                               Sports drinks like Gatorade (NO RED)                   The day of surgery:  Drink ONE (1) Pre-Surgery Clear Ensure or G2 at   0900AM the morning of surgery. Drink in one sitting. Do not sip.  This drink was given to you during your hospital  pre-op appointment visit. Nothing else to drink after completing the  Pre-Surgery Clear Ensure or  G2.          If you have questions, please contact your surgeons office.       Oral Hygiene is also important to reduce your risk of infection.                                    Remember - BRUSH YOUR TEETH THE MORNING OF SURGERY WITH YOUR REGULAR TOOTHPASTE  DENTURES WILL BE REMOVED PRIOR TO SURGERY PLEASE DO NOT APPLY Poly  grip OR ADHESIVES!!!   Do NOT smoke after Midnight   Stop all vitamins and herbal supplements 7 days before surgery.   Take these medicines the morning of surgery with A SIP OF WATER:  inhalers as usual and bring, synthroid , metoprolol               Humalog tid-            Lantus -            Metformin - none am of surgery   DO NOT TAKE ANY ORAL DIABETIC MEDICATIONS DAY OF YOUR SURGERY  Bring CPAP mask and tubing day of surgery.                              You may not have any metal on your body including hair pins, jewelry, and body piercing             Do not wear make-up, lotions, powders, perfumes/cologne, or deodorant  Do not wear nail polish including gel and S&S, artificial/acrylic nails, or any other type of covering on natural nails including finger and toenails. If you have artificial nails, gel coating, etc. that needs to be removed by a nail salon please have this removed prior to surgery or surgery may need to be canceled/ delayed if the surgeon/ anesthesia feels like they are unable to be safely monitored.   Do not shave  48 hours prior to surgery.               Men may shave face and neck.   Do not bring valuables to the hospital. [facility] IS NOT             RESPONSIBLE   FOR VALUABLES.   Contacts, glasses, dentures or bridgework may not be worn into surgery.   Bring small overnight bag day of surgery.   DO NOT BRING YOUR HOME MEDICATIONS TO THE HOSPITAL. PHARMACY WILL DISPENSE MEDICATIONS LISTED ON YOUR MEDICATION LIST TO YOU DURING YOUR ADMISSION IN THE HOSPITAL!    Patients discharged on the day of surgery will not be allowed to  drive home.  Someone NEEDS to stay with you for the first 24 hours after anesthesia.   Special Instructions: Bring a copy of your healthcare power of attorney and living will documents the day of surgery if you haven't scanned them before.              Please read over the following fact sheets you were given: IF YOU HAVE QUESTIONS ABOUT YOUR PRE-OP INSTRUCTIONS PLEASE CALL [phone].   If you received a COVID test during your pre-op visit  it is requested that you wear a mask when out in public, stay away from anyone that may not be feeling well and notify your surgeon if you develop symptoms. If you test positive for Covid or have been in contact with anyone that has tested positive in the last 10 days please notify you surgeon.      Pre-operative 4 CHG Bath Instructions   You can play a key role in reducing the risk of infection after surgery. Your skin needs to be as free of germs as possible. You can reduce the number of germs on your skin by washing with CHG (chlorhexidine  gluconate) soap before surgery. CHG is an antiseptic soap that kills germs and continues to kill germs even after washing.   DO NOT use if you have an allergy  to chlorhexidine /CHG or antibacterial soaps. If your skin becomes reddened or irritated, stop using the CHG and notify one of our RNs at [phone].   Please shower with the CHG soap starting 4 days before surgery using the following schedule:     Please keep in mind the following:  DO NOT shave, including legs and underarms, starting the day of your first shower.   You may shave your face at any point before/day of surgery.  Place clean sheets on your bed the day you start using CHG soap. Use a clean washcloth (not used since being washed) for each shower. DO NOT sleep with pets once you start using the CHG.   CHG Shower Instructions:  If you choose to wash your hair and private area, wash first with your normal shampoo/soap.  After you use shampoo/soap,  rinse your hair and body thoroughly to remove shampoo/soap residue.  Turn the water OFF and apply about 3 tablespoons (45 ml) of CHG soap to a CLEAN washcloth.  Apply CHG soap ONLY FROM YOUR NECK DOWN TO YOUR TOES (washing for 3-5 minutes)  DO NOT use CHG soap on face, private areas, open wounds, or sores.  Pay special attention to the area where your surgery is being performed.  If you are having back surgery, having someone wash your back for you may be helpful. Wait 2 minutes after CHG soap is applied, then you may rinse off the CHG soap.  Pat dry with a clean towel  Put on clean clothes/pajamas   If you choose to wear lotion, please use ONLY the CHG-compatible lotions on the back of this paper.     Additional instructions for the day of surgery: DO NOT APPLY any lotions, deodorants, cologne, or perfumes.   Put on clean/comfortable clothes.  Brush your teeth.  Ask your nurse before applying any prescription medications to the skin.      CHG Compatible Lotions   Aveeno Moisturizing lotion  Cetaphil Moisturizing Cream  Cetaphil Moisturizing Lotion  Clairol Herbal Essence Moisturizing Lotion, Dry Skin  Clairol Herbal Essence Moisturizing Lotion, Extra Dry Skin  Clairol Herbal Essence Moisturizing Lotion, Normal Skin  Curel Age Defying Therapeutic Moisturizing Lotion with Alpha Hydroxy  Curel Extreme Care Body Lotion  Curel Soothing Hands Moisturizing Hand Lotion  Curel Therapeutic Moisturizing Cream, Fragrance-Free  Curel Therapeutic Moisturizing Lotion, Fragrance-Free  Curel Therapeutic Moisturizing Lotion, Original Formula  Eucerin Daily Replenishing Lotion  Eucerin Dry Skin Therapy Plus Alpha Hydroxy Crme  Eucerin Dry Skin Therapy Plus Alpha Hydroxy Lotion  Eucerin Original Crme  Eucerin Original Lotion  Eucerin Plus Crme Eucerin Plus Lotion  Eucerin TriLipid Replenishing Lotion  Keri Anti-Bacterial Hand Lotion  Keri Deep Conditioning Original Lotion Dry Skin Formula  Softly Scented  Keri Deep Conditioning Original Lotion, Fragrance Free Sensitive Skin Formula  Keri Lotion Fast Absorbing Fragrance Free Sensitive Skin Formula  Keri Lotion Fast Absorbing Softly Scented Dry Skin Formula  Keri Original Lotion  Keri Skin Renewal Lotion Keri Silky Smooth Lotion  Keri Silky Smooth Sensitive Skin Lotion  Nivea Body Creamy Conditioning Oil  Nivea Body Extra Enriched Teacher, Adult Education Moisturizing Lotion Nivea Crme  Nivea Skin Firming Lotion  NutraDerm 30 Skin Lotion  NutraDerm Skin Lotion  NutraDerm Therapeutic Skin Cream  NutraDerm Therapeutic Skin Lotion  ProShield Protective Hand Cream  Provon moisturizing lotion

## 2024-01-28 NOTE — Progress Notes (Signed)
 Anesthesia Review:  PCP: Cardiologist :  PPM/ ICD: Device Orders: Rep Notified:  Chest x-ray : EKG : Echo : Stress test: Cardiac Cath :   Activity level:  Sleep Study/ CPAP : Fasting Blood Sugar :      / Checks Blood Sugar -- times a day:      DM- type Hgba1c-  Humalog tid-  Lantus  pm-  Metformin - none am fo surgery  Blood Thinner/ Instructions /Last Dose: ASA / Instructions/ Last Dose :    325 mg aspirin 

## 2024-01-29 ENCOUNTER — Encounter (HOSPITAL_COMMUNITY)
Admission: RE | Admit: 2024-01-29 | Discharge: 2024-01-29 | Disposition: A | Discharge status: Home / Self Care | Source: Ambulatory Visit | Attending: Orthopedic Surgery | Admitting: Orthopedic Surgery

## 2024-01-29 ENCOUNTER — Other Ambulatory Visit: Payer: Self-pay

## 2024-01-29 ENCOUNTER — Encounter (HOSPITAL_COMMUNITY): Payer: Self-pay

## 2024-01-29 VITALS — BP 153/93 | HR 61 | Temp 98.3°F | Resp 16 | Ht 63.0 in

## 2024-01-29 DIAGNOSIS — I2510 Atherosclerotic heart disease of native coronary artery without angina pectoris: Secondary | ICD-10-CM | POA: Diagnosis not present

## 2024-01-29 DIAGNOSIS — Z7984 Long term (current) use of oral hypoglycemic drugs: Secondary | ICD-10-CM | POA: Diagnosis not present

## 2024-01-29 DIAGNOSIS — I252 Old myocardial infarction: Secondary | ICD-10-CM | POA: Insufficient documentation

## 2024-01-29 DIAGNOSIS — M1711 Unilateral primary osteoarthritis, right knee: Secondary | ICD-10-CM | POA: Diagnosis not present

## 2024-01-29 DIAGNOSIS — G4734 Idiopathic sleep related nonobstructive alveolar hypoventilation: Secondary | ICD-10-CM | POA: Diagnosis not present

## 2024-01-29 DIAGNOSIS — Z794 Long term (current) use of insulin: Secondary | ICD-10-CM | POA: Diagnosis not present

## 2024-01-29 DIAGNOSIS — I480 Paroxysmal atrial fibrillation: Secondary | ICD-10-CM | POA: Insufficient documentation

## 2024-01-29 DIAGNOSIS — Z01818 Encounter for other preprocedural examination: Secondary | ICD-10-CM | POA: Insufficient documentation

## 2024-01-29 DIAGNOSIS — E039 Hypothyroidism, unspecified: Secondary | ICD-10-CM | POA: Diagnosis not present

## 2024-01-29 DIAGNOSIS — Z955 Presence of coronary angioplasty implant and graft: Secondary | ICD-10-CM | POA: Diagnosis not present

## 2024-01-29 DIAGNOSIS — I10 Essential (primary) hypertension: Secondary | ICD-10-CM | POA: Insufficient documentation

## 2024-01-29 DIAGNOSIS — Z87891 Personal history of nicotine dependence: Secondary | ICD-10-CM | POA: Diagnosis not present

## 2024-01-29 DIAGNOSIS — J449 Chronic obstructive pulmonary disease, unspecified: Secondary | ICD-10-CM | POA: Insufficient documentation

## 2024-01-29 DIAGNOSIS — G4733 Obstructive sleep apnea (adult) (pediatric): Secondary | ICD-10-CM | POA: Insufficient documentation

## 2024-01-29 DIAGNOSIS — Z8673 Personal history of transient ischemic attack (TIA), and cerebral infarction without residual deficits: Secondary | ICD-10-CM | POA: Insufficient documentation

## 2024-01-29 DIAGNOSIS — E119 Type 2 diabetes mellitus without complications: Secondary | ICD-10-CM | POA: Insufficient documentation

## 2024-01-29 HISTORY — DX: Atherosclerotic heart disease of native coronary artery without angina pectoris: I25.10

## 2024-01-29 HISTORY — DX: Chronic obstructive pulmonary disease, unspecified: J44.9

## 2024-01-29 HISTORY — DX: Cerebral infarction, unspecified: I63.9

## 2024-01-29 LAB — GLUCOSE, CAPILLARY: Glucose-Capillary: 88 mg/dL (ref 70–99)

## 2024-01-29 LAB — CBC
HCT: 39.3 % (ref 36.0–46.0)
Hemoglobin: 12.5 g/dL (ref 12.0–15.0)
MCH: 28 pg (ref 26.0–34.0)
MCHC: 31.8 g/dL (ref 30.0–36.0)
MCV: 88.1 fL (ref 80.0–100.0)
Platelets: 346 K/uL (ref 150–400)
RBC: 4.46 MIL/uL (ref 3.87–5.11)
RDW: 13.2 % (ref 11.5–15.5)
WBC: 8.9 K/uL (ref 4.0–10.5)
nRBC: 0 % (ref 0.0–0.2)

## 2024-01-29 LAB — BASIC METABOLIC PANEL WITH GFR
Anion gap: 12 (ref 5–15)
BUN: 19 mg/dL (ref 8–23)
CO2: 24 mmol/L (ref 22–32)
Calcium: 9.3 mg/dL (ref 8.9–10.3)
Chloride: 106 mmol/L (ref 98–111)
Creatinine, Ser: 0.94 mg/dL (ref 0.44–1.00)
GFR, Estimated: >60 mL/min
Glucose, Bld: 107 mg/dL — ABNORMAL HIGH (ref 70–99)
Potassium: 4.2 mmol/L (ref 3.5–5.1)
Sodium: 142 mmol/L (ref 135–145)

## 2024-01-29 LAB — HEMOGLOBIN A1C
Hgb A1c MFr Bld: 7.4 % — ABNORMAL HIGH (ref 4.8–5.6)
Mean Plasma Glucose: 165.68 mg/dL

## 2024-01-29 LAB — SURGICAL PCR SCREEN
MRSA, PCR: NEGATIVE
Staphylococcus aureus: POSITIVE — AB

## 2024-01-31 NOTE — Progress Notes (Signed)
 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 Last seen on 05/28/23. She has hx of MI and CAD and underwent DES to the RCA in 2019. Stress testing and Echo in 2020 were normal. Also with hx of PAF but patient has refused anticoagulation. Thought to be due to having hyperthyroidism which is now controlled.  Hx of CVA in 2020 and 2023. Admitted on 11/13/23 at [facility] in [place] for code stroke when she was admitted for dysarthria and aphasia. She received TNK and was admitted to ICU.  Left message for [name] that patient needs to be postponed till she is at least 3 months from her CVA. If surgery is scheduled before she is 9 months out from her CVA she will need Neurology clearance.    VS: BP (!) 153/93   Pulse 61   Temp 36.8 C (Oral)   Resp 16   Ht 5' 3 (1.6 m)   SpO2 100%   BMI 39.86 kg/m   PROVIDERS: Joella Sieving, DO   LABS: Labs reviewed: Acceptable for surgery. (all labs ordered are listed, but only abnormal results are displayed)  Labs Reviewed  SURGICAL PCR SCREEN - Abnormal; Notable for the following components:      Result Value   Staphylococcus aureus POSITIVE (*)    All other components within normal limits  HEMOGLOBIN A1C - Abnormal; Notable for the following components:   Hgb A1c MFr Bld 7.4 (*)    All other components within normal limits  BASIC  METABOLIC PANEL WITH GFR - Abnormal; Notable for the following components:   Glucose, Bld 107 (*)    All other components within normal limits  CBC  GLUCOSE, CAPILLARY     IMAGES:   EKG:   Zio monitor 07/23/2021  Normal sinus rhythm minimum heart rate 58 maximum 176 average 72 beats per minute IVCD was seen. Eleven runs of SVT, longest lasting 12.8 seconds average heart rate 134 beats. PAC burden 7.2% premature venctricular contraction burden less than 1%. No atrial fib, no heart block or VT. (triggered events coincided with sinus rhythm and PAC)   Stress test 03/18/2018  adenosine MPI: EF 92%, normal perfusion   Echo 03/19/2018   EF 60 65%, poor-quality study, mild LVH    Cath 11/27/2017  : 40% LMCA, 30% pLAD, 70% apical LAD, 70% OM1 (<9mm), diffuse 90-95% RCA (3.0x38 and 2.75x38 Resolute Onyx DES) asa and prasugrel  Cath 09/05/05   EF 60%, 75% OM1/95% RCA. PTCA/RCA same day 2.75x28 Taxus with IVUS, prox end dilated to 4.6. Well deployed with IVUS.   Past Medical History:  Diagnosis Date   Arthritis    COPD (chronic obstructive pulmonary disease) (HCC)    Coronary artery disease    Diabetes mellitus without complication (HCC)    Type II   GERD (gastroesophageal reflux disease)    Hypertension  Hypothyroidism    Myocardial infarction (HCC)    Narcolepsy    Shortness of breath dyspnea    with activity   Sleep apnea    severe OSA with O2 desaturations 06/20/14 [facility]; not curently using CPAP, Dr. Thomas Oneil presribed nocturnal O2   Stroke [facility])     Past Surgical History:  Procedure Laterality Date   ABDOMINAL HYSTERECTOMY     bladder drop surgery      BREAST LUMPECTOMY Left    COLONOSCOPY     CORONARY ANGIOPLASTY     CORONARY ANGIOPLASTY WITH STENT PLACEMENT  01/29/2006   EYE SURGERY Left    catarct   MAXIMUM ACCESS (MAS)POSTERIOR LUMBAR INTERBODY FUSION (PLIF) 1 LEVEL N/A 03/18/2015   Procedure: Lumbar four-five Maximum access  posterior lumbar interbody fusion;  Surgeon: Fairy Levels, MD;  Location: [facility];  Service: Neurosurgery;  Laterality: N/A;    MEDICATIONS:  aspirin  EC 81 MG tablet   atorvastatin (LIPITOR) 40 MG tablet   clopidogrel (PLAVIX) 75 MG tablet   dapagliflozin propanediol (FARXIGA) 10 MG TABS tablet   insulin  degludec (TRESIBA) 100 UNIT/ML FlexTouch Pen   linaclotide (LINZESS) 145 MCG CAPS capsule   meloxicam (MOBIC) 7.5 MG tablet   sitaGLIPtin-metformin  (JANUMET) 50-1000 MG tablet   gemfibrozil  (LOPID ) 600 MG tablet   levothyroxine  (SYNTHROID , LEVOTHROID) 88 MCG tablet   lisinopril -hydrochlorothiazide  (PRINZIDE ,ZESTORETIC ) 20-12.5 MG tablet   metoprolol  (LOPRESSOR ) 50 MG tablet   Vitamin D , Ergocalciferol , (DRISDOL ) 50000 units CAPS capsule   No current facility-administered medications for this encounter.   Burnard [name] [facility] Surgical Short Stay/Anesthesiology [facility] Phone [phone] 01/31/2024 9:50 AM       "

## 2024-02-04 ENCOUNTER — Encounter (HOSPITAL_COMMUNITY): Payer: Self-pay | Admitting: Medical

## 2024-02-04 ENCOUNTER — Ambulatory Visit (HOSPITAL_COMMUNITY): Admission: RE | Admit: 2024-02-04 | Source: Home / Self Care | Admitting: Orthopedic Surgery

## 2024-02-04 ENCOUNTER — Encounter (HOSPITAL_COMMUNITY): Admission: RE | Payer: Self-pay | Source: Home / Self Care

## 2024-02-04 SURGERY — ARTHROPLASTY, KNEE, TOTAL
Anesthesia: Spinal | Site: Knee | Laterality: Right
